# Patient Record
Sex: Female | Born: 1950 | Race: White | Hispanic: No | Marital: Married | Smoking: Former smoker
Health system: Southern US, Community
[De-identification: ages and names within clinical notes are randomized; demographics above are authoritative.]

## PROBLEM LIST (undated history)

## (undated) DIAGNOSIS — I10 Essential (primary) hypertension: Secondary | ICD-10-CM

## (undated) DIAGNOSIS — E785 Hyperlipidemia, unspecified: Secondary | ICD-10-CM

## (undated) HISTORY — PX: TUBAL LIGATION: SHX77

## (undated) HISTORY — PX: EYE SURGERY: SHX253

## (undated) HISTORY — PX: FOOT SURGERY: SHX648

## (undated) HISTORY — PX: TOTAL KNEE ARTHROPLASTY: SHX125

---

## 2001-05-10 ENCOUNTER — Encounter: Admission: RE | Admit: 2001-05-10 | Discharge: 2001-05-10 | Payer: Self-pay | Admitting: Orthopedic Surgery

## 2001-05-10 ENCOUNTER — Encounter: Payer: Self-pay | Admitting: Orthopedic Surgery

## 2006-08-27 ENCOUNTER — Emergency Department (HOSPITAL_COMMUNITY): Admission: EM | Admit: 2006-08-27 | Discharge: 2006-08-27 | Payer: Self-pay | Admitting: Emergency Medicine

## 2011-04-28 ENCOUNTER — Other Ambulatory Visit: Payer: Self-pay | Admitting: Orthopedic Surgery

## 2011-04-28 ENCOUNTER — Encounter (HOSPITAL_COMMUNITY): Payer: 59

## 2011-04-28 LAB — DIFFERENTIAL
Basophils Absolute: 0 10*3/uL (ref 0.0–0.1)
Basophils Relative: 0 % (ref 0–1)
Eosinophils Absolute: 0.2 10*3/uL (ref 0.0–0.7)
Eosinophils Relative: 3 % (ref 0–5)
Lymphocytes Relative: 32 % (ref 12–46)
Lymphs Abs: 2 10*3/uL (ref 0.7–4.0)
Monocytes Absolute: 0.5 10*3/uL (ref 0.1–1.0)
Monocytes Relative: 7 % (ref 3–12)
Neutro Abs: 3.6 10*3/uL (ref 1.7–7.7)
Neutrophils Relative %: 57 % (ref 43–77)

## 2011-04-28 LAB — URINALYSIS, ROUTINE W REFLEX MICROSCOPIC
Bilirubin Urine: NEGATIVE
Glucose, UA: NEGATIVE mg/dL
Ketones, ur: NEGATIVE mg/dL
Nitrite: NEGATIVE
Protein, ur: NEGATIVE mg/dL
Specific Gravity, Urine: 1.018 (ref 1.005–1.030)
Urobilinogen, UA: 0.2 mg/dL (ref 0.0–1.0)
pH: 6 (ref 5.0–8.0)

## 2011-04-28 LAB — COMPREHENSIVE METABOLIC PANEL
ALT: 23 U/L (ref 0–35)
AST: 22 U/L (ref 0–37)
Albumin: 3.8 g/dL (ref 3.5–5.2)
Alkaline Phosphatase: 109 U/L (ref 39–117)
BUN: 18 mg/dL (ref 6–23)
CO2: 27 mEq/L (ref 19–32)
Calcium: 9.8 mg/dL (ref 8.4–10.5)
Chloride: 102 mEq/L (ref 96–112)
Creatinine, Ser: 0.71 mg/dL (ref 0.50–1.10)
GFR calc Af Amer: >90 mL/min (ref >90)
GFR calc non Af Amer: >90 mL/min (ref >90)
Glucose, Bld: 83 mg/dL (ref 70–99)
Potassium: 4.2 mEq/L (ref 3.5–5.1)
Sodium: 138 mEq/L (ref 135–145)
Total Bilirubin: 0.2 mg/dL — ABNORMAL LOW (ref 0.3–1.2)
Total Protein: 7.3 g/dL (ref 6.0–8.3)

## 2011-04-28 LAB — SURGICAL PCR SCREEN
MRSA, PCR: NEGATIVE
Staphylococcus aureus: NEGATIVE

## 2011-04-28 LAB — CBC
HCT: 35.1 % — ABNORMAL LOW (ref 36.0–46.0)
Hemoglobin: 11.6 g/dL — ABNORMAL LOW (ref 12.0–15.0)
MCH: 29.5 pg (ref 26.0–34.0)
MCHC: 33 g/dL (ref 30.0–36.0)
MCV: 89.3 fL (ref 78.0–100.0)
Platelets: 227 10*3/uL (ref 150–400)
RBC: 3.93 MIL/uL (ref 3.87–5.11)
RDW: 13.1 % (ref 11.5–15.5)
WBC: 6.2 10*3/uL (ref 4.0–10.5)

## 2011-04-28 LAB — URINE MICROSCOPIC-ADD ON

## 2011-04-28 LAB — PROTIME-INR
INR: 0.93 (ref 0.00–1.49)
Prothrombin Time: 12.7 seconds (ref 11.6–15.2)

## 2011-04-28 LAB — APTT: aPTT: 27 seconds (ref 24–37)

## 2011-05-03 ENCOUNTER — Inpatient Hospital Stay (HOSPITAL_COMMUNITY)
Admission: RE | Admit: 2011-05-03 | Discharge: 2011-05-06 | DRG: 470 | Disposition: A | Discharge status: Home Health | Payer: 59 | Source: Ambulatory Visit | Attending: Orthopedic Surgery | Admitting: Orthopedic Surgery

## 2011-05-03 ENCOUNTER — Inpatient Hospital Stay (HOSPITAL_COMMUNITY): Payer: 59

## 2011-05-03 DIAGNOSIS — M1710 Unilateral primary osteoarthritis, unspecified knee: Principal | ICD-10-CM | POA: Diagnosis present

## 2011-05-03 DIAGNOSIS — R269 Unspecified abnormalities of gait and mobility: Secondary | ICD-10-CM | POA: Diagnosis present

## 2011-05-03 DIAGNOSIS — Z79899 Other long term (current) drug therapy: Secondary | ICD-10-CM

## 2011-05-03 DIAGNOSIS — Z7982 Long term (current) use of aspirin: Secondary | ICD-10-CM

## 2011-05-03 DIAGNOSIS — Z01812 Encounter for preprocedural laboratory examination: Secondary | ICD-10-CM

## 2011-05-03 LAB — TYPE AND SCREEN: ABO/RH(D): A POS

## 2011-05-03 LAB — ABO/RH: ABO/RH(D): A POS

## 2011-05-03 LAB — HEMOGLOBIN AND HEMATOCRIT, BLOOD: Hemoglobin: 11.4 g/dL — ABNORMAL LOW (ref 12.0–15.0)

## 2011-05-03 NOTE — H&P (Signed)
  [name], [name]                 ACCOUNT NO.:  [id number]  MEDICAL RECORD NO.:  [id number]  LOCATION:  1S                           FACILITY:  [facility]  PHYSICIAN:  Georges Lynch. Parul Porcelli, M.D.DATE OF BIRTH:  1951/05/15  DATE OF ADMISSION:  04/01/2011 DATE OF DISCHARGE:                             HISTORY & PHYSICAL   CHIEF COMPLAINT:  Left knee pain.  HISTORY OF PRESENT ILLNESS:  The patient is a 60 year old female with worsening left knee pain secondary to end-stage osteoarthritis.  The patient elected to have left total knee arthroplasty, Dr. Ranee Gosselin with decreased pain and increased function.  PAST MEDICAL HISTORY:  Negative.  FAMILY MEDICAL HISTORY:  Leukemia.  SOCIAL HISTORY:  The patient of Dr. Regino Schultze.  Occasional alcohol use. Does not smoke.  DRUG ALLERGIES:  None.  CURRENT MEDICATIONS: 1. Indomethacin 25 mg t.i.d. p.r.n. 2. Aspirin 81 mg daily.  REVIEW OF SYSTEMS:  Shows pain with ambulation and antalgic gait due to left knee pain.  PHYSICAL EXAMINATION:  VITAL SIGNS:  Pulse 76, respirations 16, and blood pressure 162/88. GENERAL:  The patient is a healthy-appearing 60 year old female, in no acute distress.  Pleasant mood and affect.  Alert and oriented x3. EXAMINATION OF HEAD AND NECK:  Shows cranial nerves II through XII are grossly intact.  She has full range of motion of the cervical spine. CHEST:  Active breath sounds bilaterally.  No wheezes, rhonchi, or rales. HEART:  Shows regular rate and rhythm.  No murmur. ABDOMEN:  Nontender and nondistended with active and active bowel sounds. EXTREMITIES:  Moderate tenderness bilateral knees, left greater than right especially in the medial joint line with some moderate crepitus. She does have some mild pedal edema distally.  She does have an antalgic gait. NEUROVASCULAR:  She is intact distally and capillary refill less than 2 seconds.  She has no rashes and reflexes are intact.  X-rays show end-stage  osteoarthritis of left knee.  IMPRESSION:  End-stage osteoarthritis, left knee.  PLAN OF ACTION:  Left total knee arthroplasty by Dr. Ranee Gosselin.     Thomas B. Dixon, P.A.   ______________________________ Georges Lynch [name], M.D.    TBD/MEDQ  D:  04/27/2011  T:  04/27/2011  Job:  [id number]  Electronically Signed by Standley Dakins P.A. on 05/03/2011 11:41:02 AM Electronically Signed by Ranee Gosselin M.D. on 05/03/2011 01:22:41 PM

## 2011-05-04 LAB — HEMOGLOBIN AND HEMATOCRIT, BLOOD: HCT: 33.8 % — ABNORMAL LOW (ref 36.0–46.0)

## 2011-05-05 LAB — HEMOGLOBIN AND HEMATOCRIT, BLOOD
HCT: 29.5 % — ABNORMAL LOW (ref 36.0–46.0)
Hemoglobin: 9.6 g/dL — ABNORMAL LOW (ref 12.0–15.0)

## 2011-05-05 LAB — PROTIME-INR: INR: 1.42 (ref 0.00–1.49)

## 2011-05-06 LAB — PROTIME-INR
INR: 1.57 — ABNORMAL HIGH (ref 0.00–1.49)
Prothrombin Time: 19.1 seconds — ABNORMAL HIGH (ref 11.6–15.2)

## 2011-05-07 NOTE — Discharge Summary (Signed)
  NAMEJOEY, [name]                 ACCOUNT NO.:  [id number]  MEDICAL RECORD NO.:  [id number]  LOCATION:  1617                         FACILITY:  [facility]  PHYSICIAN:  Georges Lynch. Evvie Behrmann, M.D.DATE OF BIRTH:  August 03, 1950  DATE OF ADMISSION:  05/03/2011 DATE OF DISCHARGE:                              DISCHARGE SUMMARY   She was taken to surgery by me at which time, we did a total knee arthroplasty on the left.  The surgery date was May 03, 2011.  The following day on May 04, 2011, she was stable.  I discontinued her Hemovac and she was maintained on Coumadin heparin protocol.  The following day on May 05, 2011, she was stable.  Hemoglobin was proximally was 9.6.  She was on a Coumadin heparin protocol.  Her dressing was changed and the wound looked excellent.  On May 06, 2011, this morning, her condition, she was stable.  Her INR was 1.57, hemoglobin 9.7, she was afebrile.  She has been ambulating.  She had been on a knee machine.  The final discharge date is today on May 06, 2011.  DISCHARGE DIET:  She will remain on the diet she was on prior to coming in __________ surgery.  DISCHARGE CONDITION:  Improved.  DISCHARGE MEDICINES:  Discharge medicines were all on the discharge manager plus I wrote her: 1. Percocet for pain 1 every 4 hours p.r.n. 2. She also will be maintained on Robaxin 500 mg t.i.d. p.r.n. for     spasms. 3. Coumadin 5 mg by mouth daily.  DISCHARGE INSTRUCTIONS:  Further, she will be seen at home by [facility] for manage her physical therapy, wound care and manage her Coumadin. She will have weekly INRs.  PERTINENT LABORATORY STUDIES:  Her screening test for MRSA and staph were negative.  Her hemoglobin on admission was 11.6, hematocrit 35.1, platelet count was 227,000.  She had a normal differential.  The electrolytes, sodium 138, potassium 4.2, chloride 102.  Her SGOT was 22, SGPT 23, alkaline phosphatase 109, bilirubin 0.2.  The INR was  0.93. Her PT was 12.7 and PTT was 27.  Urinalysis was within normal limits. Postop x-ray showed anatomic position of her prosthesis.          ______________________________ Georges Lynch [name], M.D.     RAG/MEDQ  D:  05/06/2011  T:  05/06/2011  Job:  [id number]  Electronically Signed by Ranee Gosselin M.D. on 05/07/2011 08:22:36 AM

## 2011-05-07 NOTE — Op Note (Signed)
[name], EDMUNDSON                 ACCOUNT NO.:  [id number]  MEDICAL RECORD NO.:  [id number]  LOCATION:  1617                         FACILITY:  [facility]  PHYSICIAN:  Georges Lynch. Offie Waide, M.D.DATE OF BIRTH:  1950-10-25  DATE OF PROCEDURE:  05/03/2011 DATE OF DISCHARGE:                              OPERATIVE REPORT   SURGEON:  Georges Lynch. [name], M.D.  ASSISTANT:  Jaquelyn Bitter. Chabon, P.A.  PREOPERATIVE DIAGNOSIS:  Severe degenerative arthritis, left knee.  POSTOPERATIVE DIAGNOSIS:  Severe degenerative arthritis, left knee.  OPERATION:  Left total knee arthroplasty utilizing DePuy system.  The sizes used were as follows:  The patella was a size 35, 3 pegs; the femoral component was a size 3, left cemented; the tibial tray was MBT revision tray size 4 cemented; the insert was a size 3, 12.5 mm thickness rotating platform.  I did use gentamicin in the cement.  PROCEDURE:  Under spinal anesthesia, routine orthopedic prep and draping of the left lower extremity was carried out.  The appropriate time-out was carried out before any incision were made.  In the holding area, I marked the appropriate left leg.  Following this, the leg was exsanguinated with Esmarch, and was also placed in a DeMayo knee holder. The knee was flexed.  The anterior incision was made.  Two flaps were created, and the flaps were reflected.  I then carried out a median and parapatellar incision reflecting the patella laterally.  The knee was flexed.  I did medial and lateral meniscectomies, and excised the anterior posterior cruciate ligaments.  Following that, a drill hole was made in the intercondylar notch.  The canal finer then was inserted.  I then thoroughly irrigated out the femoral canal.  The intramedullary guide rod was inserted.  I removed 12 mm thickness off the distal femur. Following that, the femur was measured to be a size 3.  I cut my size 3 femur with the appropriate instruments, carrying out  anterior, posterior chamfering cuts in the usual fashion.  Following that, the tibia was prepared in the usual fashion.  I completed my medial and lateral meniscectomies, then made sure I had completely excised the anterior- posterior cruciate.  I then measured the tibia to be a size 4 tray.  I then made my initial drill hole in the tibia for the MBT tray.  Prior to doing this, I utilized my spacer blocks to make sure we had the appropriate tension.  The appropriate tension was realized by using a 12.5 mm thickness insert.  Following that, I completed the completion. I removed 4 mm thickness off the affected medial side of the tibia, and I did use the intramedullary guide.  At this time, we then cut our keel cut of the proximal tibia in the usual fashion.  We then went up before inserting the trials and cut groove notch cut out of the distal femur. At this time, the trials were inserted.  We had excellent stability.  I did have a 12.5 mm thickness tray in place with the knee in extension. I then did a resurfacing procedure on the patella in the usual fashion. Three drill holes were made in the  patella.  Following that, the components were all removed.  I thoroughly irrigated out the knee, dried the knee, out cemented all 3 components in simultaneously utilizing gentamicin in the cement.  After the cement was hardened, all loose pieces of cement were removed.  I water picked the posterior compartments out, and made sure there was no loose pieces of the cement there.  Following that, before inserting my permanent prosthesis that is the tibial tray, I then injected 10 cc of FloSeal posteriorly, medially, laterally, and all around the soft tissue.  I then inserted my rotating platform tray.  The 12 mm thickness size 3 and reduced the knee and had excellent function, excellent stability.  I then inserted Hemovac drain and closed the wound layers with usual fashion over Hemovac drain. Sterile  Neosporin dressings were applied.  The patient had 2 g of IV Ancef.  Note during the case of procedure, Jodene Nam held the retractors.  He helped to remove the loose pieces of cement and he also helped with the synovectomy as well.  He also was  a Psychologist, occupational in regard to holding the knee in the proper position during the insertion of the prosthesis and the cement as well.  The indication for the procedure was the patient had previous MRI of the left knee.  She had previous noted to be bone-on-bone and also was treated with Euflexxa in the past and conservative medications by mouth as well.          ______________________________ Georges Lynch. [name], M.D.     RAG/MEDQ  D:  05/03/2011  T:  05/04/2011  Job:  [id number]  Electronically Signed by Ranee Gosselin M.D. on 05/07/2011 08:22:35 AM

## 2011-12-21 ENCOUNTER — Ambulatory Visit (INDEPENDENT_AMBULATORY_CARE_PROVIDER_SITE_OTHER): Payer: 59 | Admitting: Emergency Medicine

## 2011-12-21 ENCOUNTER — Ambulatory Visit: Payer: 59

## 2011-12-21 VITALS — BP 145/80 | HR 66 | Temp 98.5°F | Resp 16 | Ht 64.0 in | Wt 232.0 lb

## 2011-12-21 DIAGNOSIS — R21 Rash and other nonspecific skin eruption: Secondary | ICD-10-CM

## 2011-12-21 DIAGNOSIS — M549 Dorsalgia, unspecified: Secondary | ICD-10-CM

## 2011-12-21 LAB — POCT SKIN KOH: Skin KOH, POC: NEGATIVE

## 2011-12-21 MED ORDER — PREDNISONE 10 MG PO TABS: ORAL_TABLET | ORAL | Status: DC

## 2011-12-21 NOTE — Progress Notes (Signed)
  Subjective:    Patient ID: [name], female    DOB: Dec 13, 1950, 61 y.o.   MRN: [id number]  HPI patient is here with 2 complaints he her first complaint is pain in her right lower back. She states she was on her worse and worse stumbled and she was jerked and developed pain in her right SI area. She denies pain down her leg. She has a history of minimal problems with her back in the past. She is status post left knee replacement the other problem is a rash which developed Sunday night in the arm on she apparently was out on the right lung more on Sunday and this concerned that she possibly was exposed to poison ivy. She states as she mode in an area where grows but is not sure of direct contact.    Review of Systems      Objective:   Physical Exam there is tenderness over the right SI joint. There is no focal weakness into the lower sternum these appear straight leg raising is negative to a scar over her left knee is well-healed. Examination skin reveals raised maculopapular areas that are present over the chest and lower abdomen she also has lesions behind her neck and on the right side of her face.  In the area beneath her panniculus is to continue her meds rash which appears moist. The patient had applied cream to the area  [facility] reading (PRIMARY) by  Dr. Cleta Alberts , spine films showed significant facet arthritis otherwise disc spaces look normal  Results for orders placed in visit on 12/21/11  POCT SKIN KOH      Component Value Range   Skin KOH, POC Negative        Assessment & Plan:  She will be on a 6 day taper dose of prednisone for her back pain and poison ivy she will take Flexeril at night. She will use Lotrimin AF antifungal powder spray to the area beneath her panniculus. She will take either Claritin or Zyrtec one a day. She will take Benadryl 1-2 at bedtime .

## 2011-12-21 NOTE — Patient Instructions (Addendum)
Take Claritin or Zyrtec one a day during the day for itching. Take Benadryl 25 mg one to 2 at bedtime for itching use Lotrimin AF antifungal powder spray to the area of redness on her lower abdomen twice a day. Taper prednisone as prescribed .Contact Dermatitis Contact dermatitis is a reaction to certain substances that touch the skin. Contact dermatitis can be either irritant contact dermatitis or allergic contact dermatitis. Irritant contact dermatitis does not require previous exposure to the substance for a reaction to occur.Allergic contact dermatitis only occurs if you have been exposed to the substance before. Upon a repeat exposure, your body reacts to the substance.  CAUSES  Many substances can cause contact dermatitis. Irritant dermatitis is most commonly caused by repeated exposure to mildly irritating substances, such as:  Makeup.   Soaps.   Detergents.   Bleaches.   Acids.   Metal salts, such as nickel.  Allergic contact dermatitis is most commonly caused by exposure to:  Poisonous plants.   Chemicals (deodorants, shampoos).   Jewelry.   Latex.   Neomycin in triple antibiotic cream.   Preservatives in products, including clothing.  SYMPTOMS  The area of skin that is exposed may develop:  Dryness or flaking.   Redness.   Cracks.   Itching.   Pain or a burning sensation.   Blisters.  With allergic contact dermatitis, there may also be swelling in areas such as the eyelids, mouth, or genitals.  DIAGNOSIS  Your caregiver can usually tell what the problem is by doing a physical exam. In cases where the cause is uncertain and an allergic contact dermatitis is suspected, a patch skin test may be performed to help determine the cause of your dermatitis. TREATMENT Treatment includes protecting the skin from further contact with the irritating substance by avoiding that substance if possible. Barrier creams, powders, and gloves may be helpful. Your caregiver may  also recommend:  Steroid creams or ointments applied 2 times daily. For best results, soak the rash area in cool water for 20 minutes. Then apply the medicine. Cover the area with a plastic wrap. You can store the steroid cream in the refrigerator for a "chilly" effect on your rash. That may decrease itching. Oral steroid medicines may be needed in more severe cases.   Antibiotics or antibacterial ointments if a skin infection is present.   Antihistamine lotion or an antihistamine taken by mouth to ease itching.   Lubricants to keep moisture in your skin.   Burow's solution to reduce redness and soreness or to dry a weeping rash. Mix one packet or tablet of solution in 2 cups cool water. Dip a clean washcloth in the mixture, wring it out a bit, and put it on the affected area. Leave the cloth in place for 30 minutes. Do this as often as possible throughout the day.   Taking several cornstarch or baking soda baths daily if the area is too large to cover with a washcloth.  Harsh chemicals, such as alkalis or acids, can cause skin damage that is like a burn. You should flush your skin for 15 to 20 minutes with cold water after such an exposure. You should also seek immediate medical care after exposure. Bandages (dressings), antibiotics, and pain medicine may be needed for severely irritated skin.  HOME CARE INSTRUCTIONS  Avoid the substance that caused your reaction.   Keep the area of skin that is affected away from hot water, soap, sunlight, chemicals, acidic substances, or anything else that would  irritate your skin.   Do not scratch the rash. Scratching may cause the rash to become infected.   You may take cool baths to help stop the itching.   Only take over-the-counter or prescription medicines as directed by your caregiver.   See your caregiver for follow-up care as directed to make sure your skin is healing properly.  SEEK MEDICAL CARE IF:   Your condition is not better after 3  days of treatment.   You seem to be getting worse.   You see signs of infection such as swelling, tenderness, redness, soreness, or warmth in the affected area.   You have any problems related to your medicines.  Document Released: 06/17/2000 Document Revised: 06/09/2011 Document Reviewed: 11/23/2010 [facility] Patient Information 2012 [place], [place].

## 2012-08-22 ENCOUNTER — Ambulatory Visit (INDEPENDENT_AMBULATORY_CARE_PROVIDER_SITE_OTHER): Payer: 59 | Admitting: Family Medicine

## 2012-08-22 VITALS — BP 150/84 | HR 65 | Temp 98.0°F | Resp 16 | Ht 65.0 in | Wt 228.0 lb

## 2012-08-22 DIAGNOSIS — R059 Cough, unspecified: Secondary | ICD-10-CM

## 2012-08-22 DIAGNOSIS — J209 Acute bronchitis, unspecified: Secondary | ICD-10-CM

## 2012-08-22 MED ORDER — CEFDINIR 300 MG PO CAPS: 300.0000 mg | ORAL_CAPSULE | Freq: Two times a day (BID) | ORAL | Status: DC

## 2012-08-22 MED ORDER — HYDROCODONE-HOMATROPINE 5-1.5 MG/5ML PO SYRP: 5.0000 mL | ORAL_SOLUTION | Freq: Three times a day (TID) | ORAL | Status: DC | PRN

## 2012-08-22 NOTE — Patient Instructions (Addendum)
Use the cough syrup as needed but be careful as it can cause sedation. Use the antibiotic (omnicef) as directed.   If you are not better in the next few days please let me know- Sooner if worse.

## 2012-08-22 NOTE — Progress Notes (Signed)
[facility] [address] [place] [address] [id number]  Date:  08/22/2012   Name:  JAIMEE CORUM   DOB:  09-10-1950   MRN:  [id number]  PCP:  No primary provider on file.    Chief Complaint: Cough, Back Pain and Chest Pain   History of Present Illness:  [name] is a 62 y.o. very pleasant female patient who presents with the following:  She is here with cough and chest congestion, head congestion as well, for the last month or so.  She has noted lack of energy, but has not taken her temperature.  She does not think she has had a fever She does have chills.   She has noted a ST and runny nose No GI symptoms  She notes that her chest hurts when she coughs, and it hurts to press on her anterior chest  She takes just a baby asa daily, and is using alka seltzer and mucinex for her current illness.   There is no problem list on file for this patient.   No past medical history on file.  Past Surgical History  Procedure Laterality Date  . Eye surgery    . Tubal ligation    . Total knee arthroplasty    . Foot surgery      5 screws put in left foot    History  Substance Use Topics  . Smoking status: Former Games developer  . Smokeless tobacco: Not on file  . Alcohol Use: No    Family History  Problem Relation Age of Onset  . Heart disease Mother   . Hypertension Mother   . Diabetes Mother   . Diabetes Maternal Grandmother   . Diabetes Maternal Grandfather     No Known Allergies  Medication list has been reviewed and updated.  Current Outpatient Prescriptions on File Prior to Visit  Medication Sig Dispense Refill  . oxyCODONE-acetaminophen (PERCOCET) 10-325 MG per tablet Take 0.5 tablets by mouth every 6 (six) hours as needed. Old rx, has been taking for her back      . predniSONE (DELTASONE) 10 MG tablet Take 6 tablets a day for one day 5 tablets a day for one day 4 tablets a day for one day 3 tablets a day for one day 2 tablets a day for one  day one tablet a day for one  21 tablet  0   No current facility-administered medications on file prior to visit.    Review of Systems:  As per HPI- otherwise negative.   Physical Examination: Filed Vitals:   08/22/12 1457  BP: 150/84  Pulse: 65  Temp: 98 F (36.7 C)  Resp: 16   Filed Vitals:   08/22/12 1457  Height: 5\' 5"  (1.651 m)  Weight: 228 lb (103.42 kg)   Body mass index is 37.94 kg/(m^2). Ideal Body Weight: Weight in (lb) to have BMI = 25: 149.9  GEN: WDWN, NAD, Non-toxic, A & O x 3, overweight HEENT: Atraumatic, Normocephalic. Neck supple. No masses, No LAD.  Bilateral TM wnl, oropharynx normal.  PEERL,EOMI.   Sinuses are non- tender Ears and Nose: No external deformity. CV: RRR, No M/G/R. No JVD. No thrill. No extra heart sounds.  She is tender over her costochondral joints PULM: CTA B, no wheezes, crackles, rhonchi. No retractions. No resp. distress. No accessory muscle use. ABD: S, NT, ND, +BS. No rebound. No HSM.   EXTR: No c/c/e NEURO Normal gait.  PSYCH: Normally interactive.  Conversant. Not depressed or anxious appearing.  Calm demeanor.    Assessment and Plan: Cough - Plan: HYDROcodone-homatropine (HYCODAN) 5-1.5 MG/5ML syrup  Acute bronchitis - Plan: cefdinir (OMNICEF) 300 MG capsule  See patient instructions for more details.     Abbe Amsterdam, MD

## 2013-10-25 ENCOUNTER — Ambulatory Visit: Payer: 59

## 2013-10-25 ENCOUNTER — Ambulatory Visit (INDEPENDENT_AMBULATORY_CARE_PROVIDER_SITE_OTHER): Payer: 59 | Admitting: Internal Medicine

## 2013-10-25 ENCOUNTER — Ambulatory Visit (HOSPITAL_COMMUNITY)
Admission: RE | Admit: 2013-10-25 | Discharge: 2013-10-25 | Disposition: A | Discharge status: Home / Self Care | Payer: 59 | Source: Ambulatory Visit | Attending: Internal Medicine | Admitting: Internal Medicine

## 2013-10-25 VITALS — BP 146/80 | HR 69 | Temp 98.0°F | Resp 18 | Ht 64.0 in | Wt 234.0 lb

## 2013-10-25 DIAGNOSIS — M7989 Other specified soft tissue disorders: Secondary | ICD-10-CM

## 2013-10-25 DIAGNOSIS — M79609 Pain in unspecified limb: Secondary | ICD-10-CM

## 2013-10-25 DIAGNOSIS — M25569 Pain in unspecified knee: Secondary | ICD-10-CM

## 2013-10-25 MED ORDER — HYDROCODONE-ACETAMINOPHEN 5-325 MG PO TABS: 1.0000 | ORAL_TABLET | Freq: Four times a day (QID) | ORAL | Status: DC | PRN

## 2013-10-25 NOTE — Progress Notes (Signed)
   Subjective:    Patient ID: [name], female    DOB: 1950/09/10, 63 y.o.   MRN: [id number]  HPI  63 YO female patient comes in today with complaints of her right leg swelling and hurting. She states the pain is specific to below her knee to just above her ankle. The pain is keeping her awake at night. Does not resolve with ice and elevation. The swelling has progressively gotten worse over the past week and has started increasing upward. It hurts worse with walking extended periods. Pain with palpitation. Pain most anterior knee.  She states it would swell when she was on her feet for extended periods and would resolve with rest and elevation in the past.  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 She also takes care of her mother and is not able to rest as much as she used to. She does have some arthritis in her right knee.  Hx of TKR on left. No hx of inactivity, no hx cancer, no hx of DVT or PE. No injury noted.  No history of cardiac problems. No history of DVT.  No history of circulation problems. Denies SOB, CP  Former smoker quit 16 years ago 1 ppd Joint replacement in her left knee.   Review of Systems     Objective:   Physical Exam  Constitutional: She is oriented to person, place, and time. She appears well-developed and well-nourished. No distress.  HENT:  Head: Normocephalic.  Eyes: EOM are normal.  Neck: Normal range of motion.  Cardiovascular: Normal rate, regular rhythm and normal heart sounds.   Pulmonary/Chest: Effort normal and breath sounds normal.  Musculoskeletal: She exhibits tenderness.       Right knee: She exhibits normal range of motion, no swelling, no effusion, no ecchymosis, no deformity, no erythema, normal alignment, no LCL laxity and no MCL laxity. Tenderness found.       Right lower leg: She exhibits tenderness, bony tenderness and swelling. She exhibits no deformity and no laceration.  Neurological:  She is alert and oriented to person, place, and time. She exhibits normal muscle tone. Coordination normal.  Skin: No rash noted.  Psychiatric: She has a normal mood and affect.   Calf Circumference:   Left 17.5 inches  Right 17.5 Inches Thigh Circumference: Left 20.5 inches Right 21.5 Inches  No clear swelling noticeable due to obesity Most tender anterior shin right [facility] reading (PRIMARY) by  [name] Knee and calf no fx, moderate djd, no abnormality seen  Gait is normal/no limp    Assessment & Plan:  Leg strain/Pain/Swelloing Doppler tonite r/o dvt Refer to ortho next week--Victoria. Luther ParodyGioffrie

## 2013-10-25 NOTE — Progress Notes (Signed)
*  Preliminary Results* Right lower extremity venous duplex completed. Right lower extremity is negative for deep vein thrombosis. There is no evidence of right Baker's cyst.  10/25/2013 5:29 PM  Gertie FeyMichelle Evangaline Jou, RVT, RDCS, RDMS

## 2013-10-25 NOTE — Patient Instructions (Addendum)
[facility] for [facility] doppler Stress Fracture When too much stress is put on the foot, as may occur in running and jumping sports, the lengthy shafts of the bones of the forefoot become susceptible to breaking due to repetitive stress (stress fracture) because of thinness of these bone. A stress fracture is more common if osteoporosis is present or if inadequate athletic footwear is used. Shoes should be used which adequately support the sole of the foot to absorb the shocks of the activity participated in. Stress fractures are very common in competitive female runners who develop these small cracks on the surface of the bones in their legs and feet. The women most likely to suffer these injuries are those who restrict food intake and those who have irregular periods. Stress fractures usually start out as a minor discomfort in the foot or leg. The completion of fracture due to repetitive loading often occurs near the end of a long run. The pain may dissipate with rest. With the next exercise session, the pain may return earlier in the run. If an athlete notices that it hurts to touch just one spot on a bone and then stops running for a week, he or she may be tempted to return to running too soon. Often the pain is ignored in order to continue with high impact exercise. A stress fracture then develops. The athlete now has to avoid the hard pounding of running, but can ride a bike or swim for exercise once the pain has resolved with normal weight bearing until the fracture heals in 6 12 weeks. The most common sites for stress fractures are the bones in the front of the feet (metatarsals) and the long bone of the lower leg (tibia), but running can cause stress fractures anywhere in the lower extremities or pelvis. DIAGNOSIS  Usually the diagnosis is made by reviewing the patient's history. The bone involved progressively becomes more painful with activities. X-rays may show no break  within the first 2 3 weeks that pain begins. A later X-ray may show signs that the bone is healing. Having a bone scan or MRI usually makes an earlier diagnosis possible. HOME CARE INSTRUCTIONS  Treatment may include a cast or walking shoe.  High impact activities should be stopped until advised by your caregiver.  Wear shoes with adequate shock absorbing abilities and good support of the sole of the foot. This is especially important in the arch of the foot.  Alternative exercise may be undertaken while waiting for healing. This may include bicycling and swimming. If you do not have a cast or splint:  You may walk on your injured foot as tolerated or advised.  Do not put any weight on your injured foot until instructed. Slowly increase the amount of time you walk on the foot as the pain allows or as advised.  Use crutches until you can bear weight without pain. A gradual increase in weight bearing may help.  Apply ice to the injured area for the first 2 days after you have been treated or as directed by your caregiver.  Put ice in a plastic bag.  Place a towel between your skin and the bag.  Leave the ice on for 15 20 minutes at a time, every hour while you are awake.  Only take over-the-counter or prescription medicines for pain or discomfort as directed by your caregiver.  If your caregiver has given you a follow-up appointment, it is very important to keep that  appointment. Not keeping the appointment could result in a chronic or permanent injury, pain, and disability. SEEK IMMEDIATE MEDICAL CARE IF:   Pain is becoming worse rather than better.  Pain is uncontrolled with medicine.  You have increased swelling or redness in the foot.  The feeling in the foot or leg is diminished. MAKE SURE YOU:   Understand these instructions.  Will watch your condition.  Will get help right away if you are not doing well or get worse. Document Released: 09/10/2002 Document Revised:  10/15/2012 Document Reviewed: 02/04/2008 [facility] Patient Information 2014 [employer] [place]. Deep Vein Thrombosis A deep vein thrombosis (DVT) is a blood clot that develops in the deep, larger veins of the leg, arm, or pelvis. These are more dangerous than clots that might form in veins near the surface of the body. A DVT can lead to complications if the clot breaks off and travels in the bloodstream to the lungs.  A DVT can damage the valves in your leg veins, so that instead of flowing upward, the blood pools in the lower leg. This is called post-thrombotic syndrome, and it can result in pain, swelling, discoloration, and sores on the leg. CAUSES Usually, several things contribute to blood clots forming. Contributing factors include:  The flow of blood slows down.  The inside of the vein is damaged in some way.  You have a condition that makes blood clot more easily. RISK FACTORS Some people are more likely than others to develop blood clots. Risk factors include:   Older age, especially over 62 years of age.  Having a family history of blood clots or if you have already had a blot clot.  Having major or lengthy surgery. This is especially true for surgery on the hip, knee, or belly (abdomen). Hip surgery is particularly high risk.  Breaking a hip or leg.  Sitting or lying still for a long time. This includes long-distance travel, paralysis, or recovery from an illness or surgery.  Having cancer or cancer treatment.  Having a long, thin tube (catheter) placed inside a vein during a medical procedure.  Being overweight (obese).  Pregnancy and childbirth.  Hormone changes make the blood clot more easily during pregnancy.  The fetus puts pressure on the veins of the pelvis.  There is a risk of injury to veins during delivery or a caesarean. The risk is highest just after childbirth.  Medicines with the female hormone estrogen. This includes birth control pills and hormone  replacement therapy.  Smoking.  Other circulation or heart problems.  SIGNS AND SYMPTOMS When a clot forms, it can either partially or totally block the blood flow in that vein. Symptoms of a DVT can include:  Swelling of the leg or arm, especially if one side is much worse.  Warmth and redness of the leg or arm, especially if one side is much worse.  Pain in an arm or leg. If the clot is in the leg, symptoms may be more noticeable or worse when standing or walking. The symptoms of a DVT that has traveled to the lungs (pulmonary embolism, PE) usually start suddenly and include:  Shortness of breath.  Coughing.  Coughing up blood or blood-tinged phlegm.  Chest pain. The chest pain is often worse with deep breaths.  Rapid heartbeat. Anyone with these symptoms should get emergency medical treatment right away. Call your local emergency services (911 in the [place] if you have these symptoms. DIAGNOSIS If a DVT is suspected, your health care provider  will take a full medical history and perform a physical exam. Tests that also may be required include:  Blood tests, including studies of the clotting properties of the blood.  Ultrasonography to see if you have clots in your legs or lungs.  X-rays to show the flow of blood when dye is injected into the veins (venography).  Studies of your lungs if you have any chest symptoms. PREVENTION  Exercise the legs regularly. Take a brisk 30-minute walk every day.  Maintain a weight that is appropriate for your height.  Avoid sitting or lying in bed for long periods of time without moving your legs.  Women, particularly those over the age of 35 years, should consider the risks and benefits of taking estrogen medicines, including birth control pills.  Do not smoke, especially if you take estrogen medicines.  Long-distance travel can increase your risk of DVT. You should exercise your legs by walking or pumping the muscles every  hour.  In-hospital prevention:  Many of the risk factors above relate to situations that exist with hospitalization, either for illness, injury, or elective surgery.  Your health care provider will assess you for the need for venous thromboembolism prophylaxis when you are admitted to the hospital. If you are having surgery, your surgeon will assess you the day of or day after surgery.  Prevention may include medical and nonmedical measures. TREATMENT Once identified, a DVT can be treated. It can also be prevented in some circumstances. Once you have had a DVT, you may be at increased risk for a DVT in the future. The most common treatment for DVT is blood thinning (anticoagulant) medicine, which reduces the blood's tendency to clot. Anticoagulants can stop new blood clots from forming and stop old ones from growing. They cannot dissolve existing clots. Your body does this by itself over time. Anticoagulants can be given by mouth, by IV access, or by injection. Your health care provider will determine the best program for you. Other medicines or treatments that may be used are:  Heparin or related medicines (low molecular weight heparin) are usually the first treatment for a blood clot. They act quickly. However, they cannot be taken orally.  Heparin can cause a fall in a component of blood that stops bleeding and forms blood clots (platelets). You will be monitored with blood tests to be sure this does not occur.  Warfarin is an anticoagulant that can be swallowed. It takes a few days to start working, so usually heparin or related medicines are used in combination. Once warfarin is working, heparin is usually stopped.  Less commonly, clot dissolving drugs (thrombolytics) are used to dissolve a DVT. They carry a high risk of bleeding, so they are used mainly in severe cases, where your life or a limb is threatened.  Very rarely, a blood clot in the leg needs to be removed surgically.  If you  are unable to take anticoagulants, your health care provider may arrange for you to have a filter placed in a main vein in your abdomen. This filter prevents clots from traveling to your lungs. HOME CARE INSTRUCTIONS  Take all medicines prescribed by your health care provider. Only take over-the-counter or prescription medicines for pain, fever, or discomfort as directed by your health care provider.  Warfarin. Most people will continue taking warfarin after hospital discharge. Your health care provider will advise you on the length of treatment (usually 3 6 months, sometimes lifelong).  Too much and too little warfarin are both  dangerous. Too much warfarin increases the risk of bleeding. Too little warfarin continues to allow the risk for blood clots. While taking warfarin, you will need to have regular blood tests to measure your blood clotting time. These blood tests usually include both the prothrombin time (PT) and international normalized ratio (INR) tests. The PT and INR results allow your health care provider to adjust your dose of warfarin. The dose can change for many reasons. It is critically important that you take warfarin exactly as prescribed, and that you have your PT and INR levels drawn exactly as directed.  Many foods, especially foods high in vitamin K, can interfere with warfarin and affect the PT and INR results. Foods high in vitamin K include spinach, kale, broccoli, cabbage, collard and turnip greens, brussel sprouts, peas, cauliflower, seaweed, and parsley as well as beef and pork liver, green tea, and soybean oil. You should eat a consistent amount of foods high in vitamin K. Avoid major changes in your diet, or notify your health care provider before changing your diet. Arrange a visit with a dietitian to answer your questions.  Many medicines can interfere with warfarin and affect the PT and INR results. You must tell your health care provider about any and all medicines you  take. This includes all vitamins and supplements. Be especially cautious with aspirin and anti-inflammatory medicines. Ask your health care provider before taking these. Do not take or discontinue any prescribed or over-the-counter medicine except on the advice of your health care provider or pharmacist.  Warfarin can have side effects, primarily excessive bruising or bleeding. You will need to hold pressure over cuts for longer than usual. Your health care provider or pharmacist will discuss other potential side effects.  Alcohol can change the body's ability to handle warfarin. It is best to avoid alcoholic drinks or consume only very small amounts while taking warfarin. Notify your health care provider if you change your alcohol intake.  Notify your dentist or other health care providers before procedures.  Activity. Ask your health care provider how soon you can go back to normal activities. It is important to stay active to prevent blood clots. If you are on anticoagulant medicine, avoid contact sports.  Exercise. It is very important to exercise. This is especially important while traveling, sitting, or standing for long periods of time. Exercise your legs by walking or by pumping the muscles frequently. Take frequent walks.  Compression stockings. These are tight elastic stockings that apply pressure to the lower legs. This pressure can help keep the blood in the legs from clotting. You may need to wear compression stockings at home to help prevent a DVT.  Do not smoke. If you smoke, quit. Ask your health care provider for help with quitting smoking.  Learn as much as you can about DVT. Knowing more about the condition should help you keep it from coming back.  Wear a medical alert bracelet or carry a medical alert card. SEEK MEDICAL CARE IF:  You notice a rapid heartbeat.  You feel weaker or more tired than usual.  You feel faint.  You notice increased bruising.  You feel your  symptoms are not getting better in the time expected.  You believe you are having side effects of medicine. SEEK IMMEDIATE MEDICAL CARE IF:  You have chest pain.  You have trouble breathing.  You have new or increased swelling or pain in one leg.  You cough up blood.  You notice blood in vomit,  in a bowel movement, or in urine. MAKE SURE YOU:  Understand these instructions.  Will watch your condition.  Will get help right away if you are not doing well or get worse. Document Released: 06/20/2005 Document Revised: 04/10/2013 Document Reviewed: 02/25/2013 [facility] Patient Information 2014 [place], [place]. Arthritis, Nonspecific Arthritis is inflammation of a joint. This usually means pain, redness, warmth or swelling are present. One or more joints may be involved. There are a number of types of arthritis. Your caregiver may not be able to tell what type of arthritis you have right away. CAUSES  The most common cause of arthritis is the wear and tear on the joint (osteoarthritis). This causes damage to the cartilage, which can break down over time. The knees, hips, back and neck are most often affected by this type of arthritis. Other types of arthritis and common causes of joint pain include:  Sprains and other injuries near the joint. Sometimes minor sprains and injuries cause pain and swelling that develop hours later.  Rheumatoid arthritis. This affects hands, feet and knees. It usually affects both sides of your body at the same time. It is often associated with chronic ailments, fever, weight loss and general weakness.  [place] arthritis. Gout and pseudo gout can cause occasional acute severe pain, redness and swelling in the foot, ankle, or knee.  Infectious arthritis. Bacteria can get into a joint through a break in overlying skin. This can cause infection of the joint. Bacteria and viruses can also spread through the blood and affect your joints.  Drug, infectious and  allergy reactions. Sometimes joints can become mildly painful and slightly swollen with these types of illnesses. SYMPTOMS   Pain is the main symptom.  Your joint or joints can also be red, swollen and warm or hot to the touch.  You may have a fever with certain types of arthritis, or even feel overall ill.  The joint with arthritis will hurt with movement. Stiffness is present with some types of arthritis. DIAGNOSIS  Your caregiver will suspect arthritis based on your description of your symptoms and on your exam. Testing may be needed to find the type of arthritis:  Blood and sometimes urine tests.  X-ray tests and sometimes CT or MRI scans.  Removal of fluid from the joint (arthrocentesis) is done to check for bacteria, crystals or other causes. Your caregiver (or a specialist) will numb the area over the joint with a local anesthetic, and use a needle to remove joint fluid for examination. This procedure is only minimally uncomfortable.  Even with these tests, your caregiver may not be able to tell what kind of arthritis you have. Consultation with a specialist (rheumatologist) may be helpful. TREATMENT  Your caregiver will discuss with you treatment specific to your type of arthritis. If the specific type cannot be determined, then the following general recommendations may apply. Treatment of severe joint pain includes:  Rest.  Elevation.  Anti-inflammatory medication (for example, ibuprofen) may be prescribed. Avoiding activities that cause increased pain.  Only take over-the-counter or prescription medicines for pain and discomfort as recommended by your caregiver.  Cold packs over an inflamed joint may be used for 10 to 15 minutes every hour. Hot packs sometimes feel better, but do not use overnight. Do not use hot packs if you are diabetic without your caregiver's permission.  A cortisone shot into arthritic joints may help reduce pain and swelling.  Any acute arthritis  that gets worse over the next 1 to  2 days needs to be looked at to be sure there is no joint infection. Long-term arthritis treatment involves modifying activities and lifestyle to reduce joint stress jarring. This can include weight loss. Also, exercise is needed to nourish the joint cartilage and remove waste. This helps keep the muscles around the joint strong. HOME CARE INSTRUCTIONS   Do not take aspirin to relieve pain if gout is suspected. This elevates uric acid levels.  Only take over-the-counter or prescription medicines for pain, discomfort or fever as directed by your caregiver.  Rest the joint as much as possible.  If your joint is swollen, keep it elevated.  Use crutches if the painful joint is in your leg.  Drinking plenty of fluids may help for certain types of arthritis.  Follow your caregiver's dietary instructions.  Try low-impact exercise such as:  Swimming.  Water aerobics.  Biking.  Walking.  Morning stiffness is often relieved by a warm shower.  Put your joints through regular range-of-motion. SEEK MEDICAL CARE IF:   You do not feel better in 24 hours or are getting worse.  You have side effects to medications, or are not getting better with treatment. SEEK IMMEDIATE MEDICAL CARE IF:   You have a fever.  You develop severe joint pain, swelling or redness.  Many joints are involved and become painful and swollen.  There is severe back pain and/or leg weakness.  You have loss of bowel or bladder control. Document Released: 07/28/2004 Document Revised: 09/12/2011 Document Reviewed: 08/13/2008 [facility] Patient Information 2014 [place], [place].

## 2013-10-26 ENCOUNTER — Telehealth: Payer: Self-pay

## 2013-10-26 NOTE — Telephone Encounter (Signed)
Patient needs to know her xray results, she was told the radiologist has already possibly written their impressions on it. Please call if ready. I told her Dr. Perrin MalteseGuest doesn't return until Tuesday and maybe someone else could possibly read it if not he will call her with results. She understood, she may get a call tomorrow or when he returns. She just wanted to know so she can follow up with her ortho dr. Please call if results are ready. Thanks!

## 2013-10-26 NOTE — Telephone Encounter (Signed)
Can you review please. Thanks

## 2013-10-27 NOTE — Telephone Encounter (Signed)
OVer-read was consistent with Dr. Ernestene MentionGuest's readings.  The tib-fib was normal.  No fracture.  The knee revealed arthritis.

## 2013-10-27 NOTE — Telephone Encounter (Signed)
Pt.notified

## 2015-07-28 IMAGING — CR DG TIBIA/FIBULA 2V*R*
3 series · 3 of 3 positions shown · non-contrast
Comparison: None.

CLINICAL DATA: Leg pain.

EXAM:
RIGHT TIBIA AND FIBULA - 2 VIEW

[AP (1 of 2)]
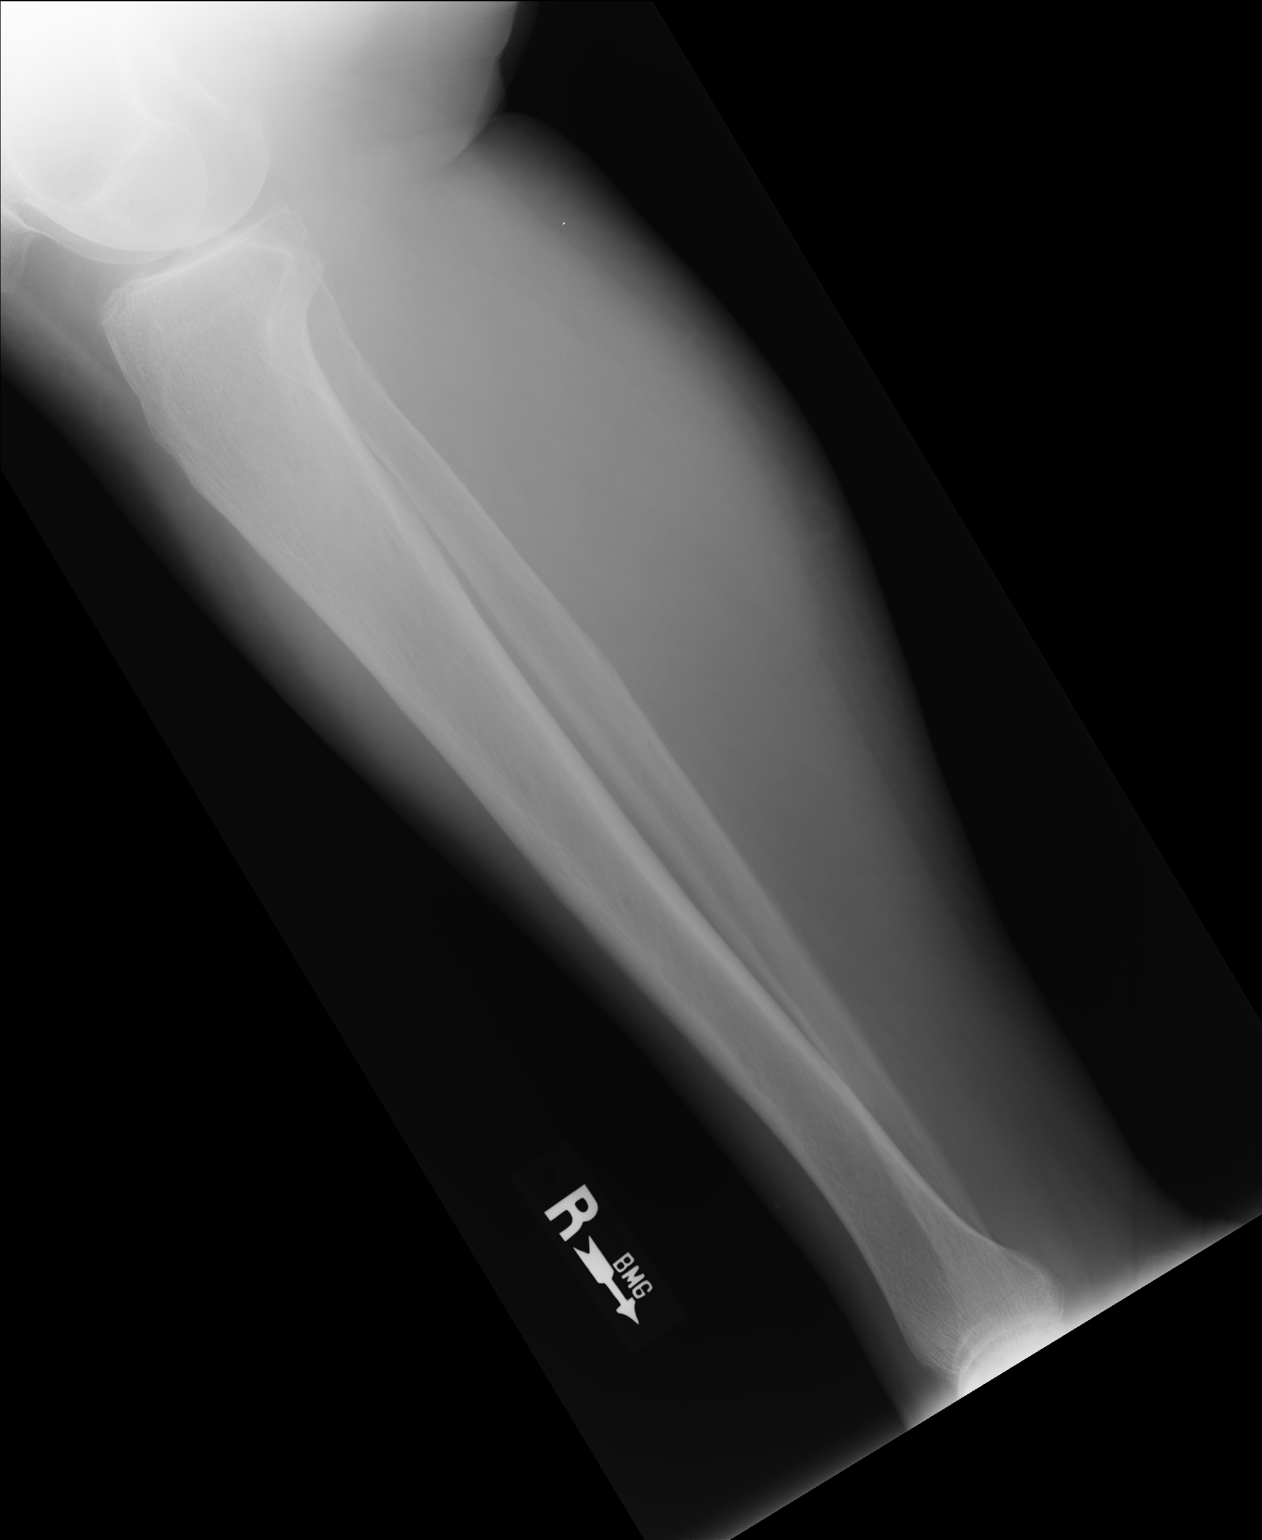

[lateral]
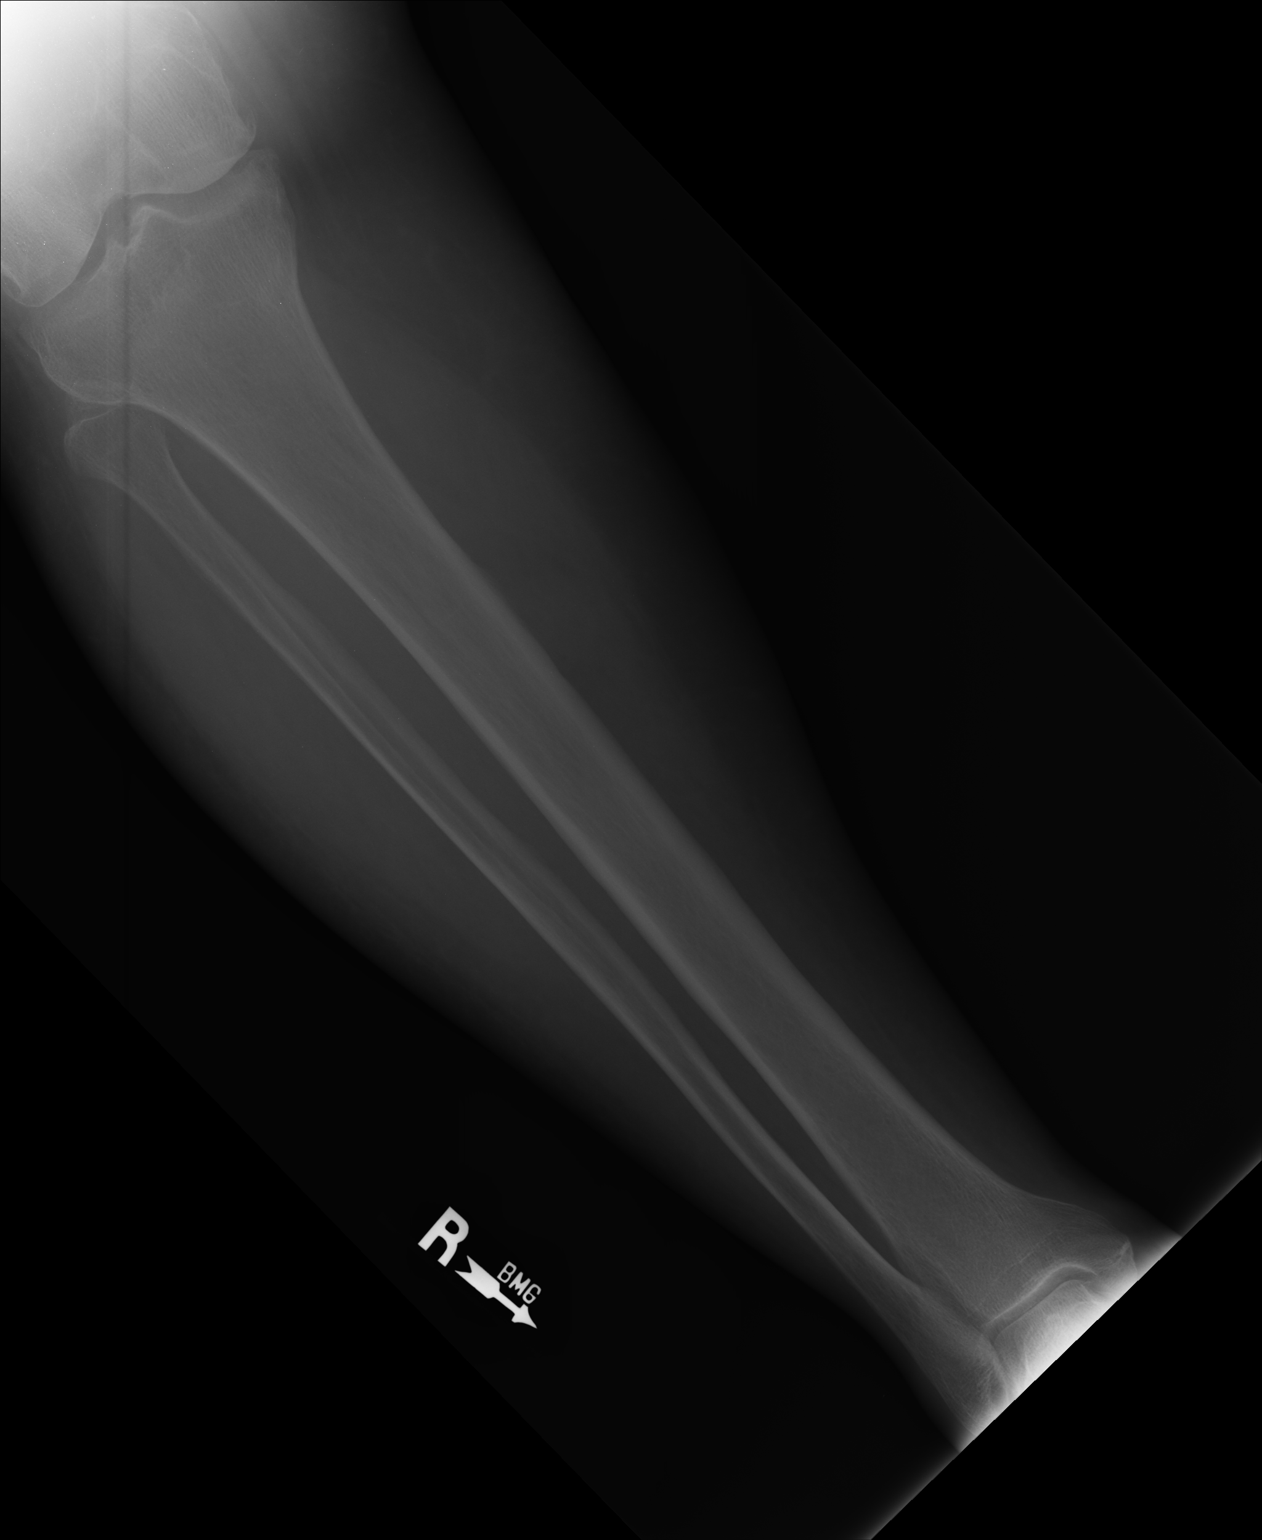

[AP (2 of 2)]
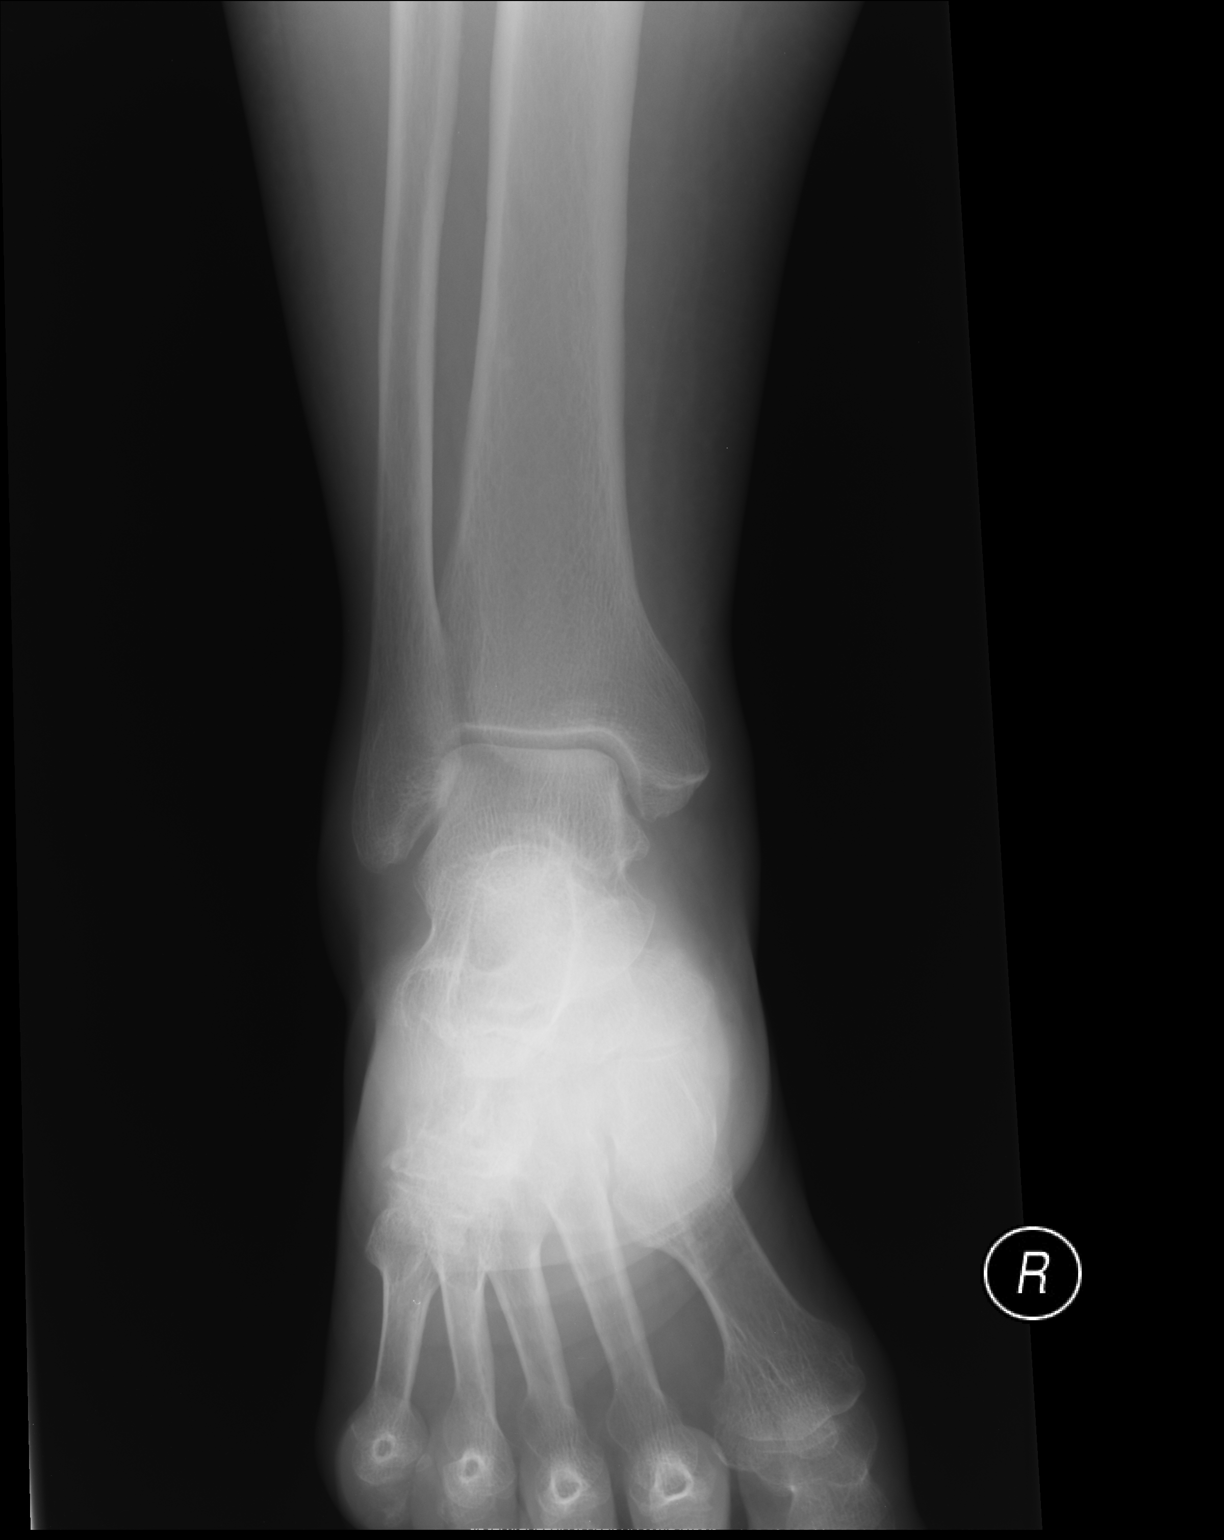

[3 of 3 positions shown; findings below may reference images not displayed]

FINDINGS: The mineralization and alignment are normal. There is no evidence of
acute fracture or dislocation. No focal soft tissue abnormalities
are identified.
IMPRESSION: Negative lower leg radiographs.

## 2017-05-12 DIAGNOSIS — H40033 Anatomical narrow angle, bilateral: Secondary | ICD-10-CM | POA: Diagnosis not present

## 2017-05-12 DIAGNOSIS — H1013 Acute atopic conjunctivitis, bilateral: Secondary | ICD-10-CM | POA: Diagnosis not present

## 2018-04-27 DIAGNOSIS — H43393 Other vitreous opacities, bilateral: Secondary | ICD-10-CM | POA: Diagnosis not present

## 2018-04-27 DIAGNOSIS — H40033 Anatomical narrow angle, bilateral: Secondary | ICD-10-CM | POA: Diagnosis not present

## 2018-12-28 DIAGNOSIS — S0990XA Unspecified injury of head, initial encounter: Secondary | ICD-10-CM | POA: Diagnosis not present

## 2018-12-28 DIAGNOSIS — R079 Chest pain, unspecified: Secondary | ICD-10-CM | POA: Diagnosis not present

## 2018-12-28 DIAGNOSIS — G8911 Acute pain due to trauma: Secondary | ICD-10-CM | POA: Diagnosis not present

## 2018-12-28 DIAGNOSIS — M25552 Pain in left hip: Secondary | ICD-10-CM | POA: Diagnosis not present

## 2018-12-28 DIAGNOSIS — S298XXA Other specified injuries of thorax, initial encounter: Secondary | ICD-10-CM | POA: Diagnosis not present

## 2018-12-28 DIAGNOSIS — M1990 Unspecified osteoarthritis, unspecified site: Secondary | ICD-10-CM | POA: Diagnosis not present

## 2018-12-28 DIAGNOSIS — R0789 Other chest pain: Secondary | ICD-10-CM | POA: Diagnosis not present

## 2018-12-28 DIAGNOSIS — M160 Bilateral primary osteoarthritis of hip: Secondary | ICD-10-CM | POA: Diagnosis not present

## 2018-12-28 DIAGNOSIS — S20212A Contusion of left front wall of thorax, initial encounter: Secondary | ICD-10-CM | POA: Diagnosis not present

## 2019-06-04 ENCOUNTER — Other Ambulatory Visit: Payer: Self-pay | Admitting: Internal Medicine

## 2019-06-04 DIAGNOSIS — Z1231 Encounter for screening mammogram for malignant neoplasm of breast: Secondary | ICD-10-CM

## 2020-11-16 ENCOUNTER — Ambulatory Visit: Payer: Medicare HMO

## 2020-11-16 ENCOUNTER — Ambulatory Visit (INDEPENDENT_AMBULATORY_CARE_PROVIDER_SITE_OTHER): Payer: Medicare HMO | Admitting: Orthopedic Surgery

## 2020-11-16 ENCOUNTER — Encounter: Payer: Self-pay | Admitting: Orthopedic Surgery

## 2020-11-16 ENCOUNTER — Other Ambulatory Visit: Payer: Self-pay

## 2020-11-16 VITALS — BP 143/79 | HR 77 | Resp 16 | Ht 64.0 in | Wt 210.6 lb

## 2020-11-16 DIAGNOSIS — M1711 Unilateral primary osteoarthritis, right knee: Secondary | ICD-10-CM | POA: Diagnosis not present

## 2020-11-16 DIAGNOSIS — M1710 Unilateral primary osteoarthritis, unspecified knee: Secondary | ICD-10-CM

## 2020-11-16 MED ORDER — MELOXICAM 7.5 MG PO TABS: 7.5000 mg | ORAL_TABLET | Freq: Every day | ORAL | 5 refills | Status: DC

## 2020-11-16 NOTE — Progress Notes (Signed)
  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  Patient was given an injection and started on anti-inflammatory medication  Follow-up in 4 weeks  Chief Complaint  Patient presents with  . New Patient (Initial Visit)    Right knee    70 year old female status post left total knee did well presents with chronic right knee pain.  She says it feels like an ice pick is going to the medial side of her left knee this is exacerbated by standing and walking at work.  Pain is been progressing over a year   Review of Systems  Constitutional: Positive for malaise/fatigue.  Cardiovascular: Positive for leg swelling.  Endo/Heme/Allergies: Positive for environmental allergies. Bruises/bleeds easily.  All other systems reviewed and are negative.    No past medical history on file.  Past Surgical History:  Procedure Laterality Date  . EYE SURGERY    . FOOT SURGERY     5 screws put in left foot  . TOTAL KNEE ARTHROPLASTY    . TUBAL LIGATION      Family History  Problem Relation Age of Onset  . Heart disease Mother   . Hypertension Mother   . Diabetes Mother   . Diabetes Maternal Grandmother   . Diabetes Maternal Grandfather    Social History   Tobacco Use  . Smoking status: Former Games developer  . Smokeless tobacco: Never Used  Substance Use Topics  . Alcohol use: No  . Drug use: No    No Known Allergies  Current Meds  Medication Sig  . meloxicam (MOBIC) 7.5 MG tablet Take 1 tablet (7.5 mg total) by mouth daily.    BP (!) 143/79   Pulse 77   Resp 16   Ht 5\' 4"  (1.626 m)   Wt 210 lb 9.6 oz (95.5 kg)   BMI 36.15 kg/m   Physical Exam  General appearance: Well-developed well-nourished no gross deformities  Cardiovascular normal pulse and perfusion normal color with bilateral edema  Neurologically no sensation loss or deficits or pathologic reflexes  Psychological: Awake alert and  oriented x3 mood and affect normal  Skin no lacerations or ulcerations no nodularity no palpable masses, no erythema or nodularity  Musculoskeletal:   Tenderness over the medial compartment of the right knee with no effusion skin is intact range of motion is 5-1 20 knee is stable muscle tone is normal     MEDICAL DECISION MAKING  A.  Encounter Diagnosis  Name Primary?  Arthritis of knee Yes    B. DATA ANALYSED:   IMAGING: Interpretation of images: Internal images show varus mild severe arthritis medial compartment mild arthritis lateral and patellofemoral compartment and secondary bone changes of osteophytes  Orders: None  Outside records reviewed: None   C. MANAGEMENT   Injection right knee Permission given Site confirmed his right knee Medication Celestone 6 mg lidocaine 1% 2 cc Lateral approach Alcohol Ethyl chloride 20-gauge needle Injection given no complications    Meds ordered this encounter  Medications  . meloxicam (MOBIC) 7.5 MG tablet    Sig: Take 1 tablet (7.5 mg total) by mouth daily.    Dispense:  30 tablet    Refill:  5   4 weeks   Marland Kitchen, MD  11/16/2020 1:52 PM

## 2020-11-16 NOTE — Patient Instructions (Addendum)
You have received an injection of steroids into the joint. 15% of patients will have increased pain within the 24 hours postinjection.   This is transient and will go away.   We recommend that you use ice packs on the injection site for 20 minutes every 2 hours and extra strength Tylenol 2 tablets every 8 as needed until the pain resolves.  If you continue to have pain after taking the Tylenol and using the ice please call the office for further instructions.  Osteoarthritis  Osteoarthritis is a type of arthritis. It refers to joint pain or joint disease. Osteoarthritis affects tissue that covers the ends of bones in joints (cartilage). Cartilage acts as a cushion between the bones and helps them move smoothly. Osteoarthritis occurs when cartilage in the joints gets worn down. Osteoarthritis is sometimes called "wear and tear" arthritis. Osteoarthritis is the most common form of arthritis. It often occurs in older people. It is a condition that gets worse over time. The joints most often affected by this condition are in the fingers, toes, hips, knees, and spine, including the neck and lower back. What are the causes? This condition is caused by the wearing down of cartilage that covers the ends of bones. What increases the risk? The following factors may make you more likely to develop this condition:  Being age 50 or older.  Obesity.  Overuse of joints.  Past injury of a joint.  Past surgery on a joint.  Family history of osteoarthritis. What are the signs or symptoms? The main symptoms of this condition are pain, swelling, and stiffness in the joint. Other symptoms may include:  An enlarged joint.  More pain and further damage caused by small pieces of bone or cartilage that break off and float inside of the joint.  Small deposits of bone (osteophytes) that grow on the edges of the joint.  A grating or scraping feeling inside the joint when you move it.  Popping or creaking  sounds when you move.  Difficulty walking or exercising.  An inability to grip items, twist your hand(s), or control the movements of your hands and fingers. How is this diagnosed? This condition may be diagnosed based on:  Your medical history.  A physical exam.  Your symptoms.  X-rays of the affected joint(s).  Blood tests to rule out other types of arthritis. How is this treated? There is no cure for this condition, but treatment can help control pain and improve joint function. Treatment may include a combination of therapies, such as:  Pain relief techniques, such as: ? Applying heat and cold to the joint. ? Massage. ? A form of talk therapy called cognitive behavioral therapy (CBT). This therapy helps you set goals and follow up on the changes that you make.  Medicines for pain and inflammation. The medicines can be taken by mouth or applied to the skin. They include: ? NSAIDs, such as ibuprofen. ? Prescription medicines. ? Strong anti-inflammatory medicines (corticosteroids). ? Certain nutritional supplements.  A prescribed exercise program. You may work with a physical therapist.  Assistive devices, such as a brace, wrap, splint, specialized glove, or cane.  A weight control plan.  Surgery, such as: ? An osteotomy. This is done to reposition the bones and relieve pain or to remove loose pieces of bone and cartilage. ? Joint replacement surgery. You may need this surgery if you have advanced osteoarthritis. Follow these instructions at home: Activity  Rest your affected joints as told by your health  care provider.  Exercise as told by your health care provider. He or she may recommend specific types of exercise, such as: ? Strengthening exercises. These are done to strengthen the muscles that support joints affected by arthritis. ? Aerobic activities. These are exercises, such as brisk walking or water aerobics, that increase your heart rate. ? Range-of-motion  activities. These help your joints move more easily. ? Balance and agility exercises. Managing pain, stiffness, and swelling  If directed, apply heat to the affected area as often as told by your health care provider. Use the heat source that your health care provider recommends, such as a moist heat pack or a heating pad. ? If you have a removable assistive device, remove it as told by your health care provider. ? Place a towel between your skin and the heat source. If your health care provider tells you to keep the assistive device on while you apply heat, place a towel between the assistive device and the heat source. ? Leave the heat on for 20-30 minutes. ? Remove the heat if your skin turns bright red. This is especially important if you are unable to feel pain, heat, or cold. You may have a greater risk of getting burned.  If directed, put ice on the affected area. To do this: ? If you have a removable assistive device, remove it as told by your health care provider. ? Put ice in a plastic bag. ? Place a towel between your skin and the bag. If your health care provider tells you to keep the assistive device on during icing, place a towel between the assistive device and the bag. ? Leave the ice on for 20 minutes, 2-3 times a day. ? Move your fingers or toes often to reduce stiffness and swelling. ? Raise (elevate) the injured area above the level of your heart while you are sitting or lying down.      General instructions  Take over-the-counter and prescription medicines only as told by your health care provider.  Maintain a healthy weight. Follow instructions from your health care provider for weight control.  Do not use any products that contain nicotine or tobacco, such as cigarettes, e-cigarettes, and chewing tobacco. If you need help quitting, ask your health care provider.  Use assistive devices as told by your health care provider.  Keep all follow-up visits as told by your  health care provider. This is important. Where to find more information  [employer] of Arthritis and Musculoskeletal and Skin Diseases: www.niams.http://www.myers.net/  [employer] on Aging: https://walker.com/  American College of Rheumatology: www.rheumatology.org Contact a health care provider if:  You have redness, swelling, or a feeling of warmth in a joint that gets worse.  You have a fever along with joint or muscle aches.  You develop a rash.  You have trouble doing your normal activities. Get help right away if:  You have pain that gets worse and is not relieved by pain medicine. Summary  Osteoarthritis is a type of arthritis that affects tissue covering the ends of bones in joints (cartilage).  This condition is caused by the wearing down of cartilage that covers the ends of bones.  The main symptom of this condition is pain, swelling, and stiffness in the joint.  There is no cure for this condition, but treatment can help control pain and improve joint function. This information is not intended to replace advice given to you by your health care provider. Make sure you discuss any  questions you have with your health care provider. Document Revised: 06/17/2019 Document Reviewed: 06/17/2019 Elsevier Patient Education  2021 [employer].

## 2020-12-14 ENCOUNTER — Other Ambulatory Visit: Payer: Self-pay

## 2020-12-14 ENCOUNTER — Encounter: Payer: Self-pay | Admitting: Orthopedic Surgery

## 2020-12-14 ENCOUNTER — Ambulatory Visit: Payer: Medicare HMO | Admitting: Orthopedic Surgery

## 2020-12-14 VITALS — BP 120/76 | HR 72 | Ht 66.0 in | Wt 209.0 lb

## 2020-12-14 DIAGNOSIS — M1710 Unilateral primary osteoarthritis, unspecified knee: Secondary | ICD-10-CM

## 2020-12-14 DIAGNOSIS — M1711 Unilateral primary osteoarthritis, right knee: Secondary | ICD-10-CM | POA: Diagnosis not present

## 2020-12-14 DIAGNOSIS — M25561 Pain in right knee: Secondary | ICD-10-CM

## 2020-12-14 DIAGNOSIS — G8929 Other chronic pain: Secondary | ICD-10-CM

## 2020-12-14 NOTE — Progress Notes (Signed)
  Follow-up  Chief Complaint  Patient presents with   arthritis of knee    Right knee/not really feeling better/most of pain on inside of knee      70 year old female with osteoarthritis right knee received an injection last visit not much better.  She wants to wait to have surgery until wintertime  She is still very active riding horses and doing things in the summertime   Physical Exam Constitutional:      General: She is not in acute distress.    Appearance: She is well-developed.     Comments: Well developed, well nourished Normal grooming and hygiene     Cardiovascular:     Comments: No peripheral edema Skin:    General: Skin is warm and dry.  Neurological:     Mental Status: She is alert and oriented to person, place, and time.     Sensory: No sensory deficit.     Coordination: Coordination normal.     Gait: Gait normal.     Deep Tendon Reflexes: Reflexes are normal and symmetric.  Psychiatric:        Mood and Affect: Mood normal.        Behavior: Behavior normal.        Thought Content: Thought content normal.        Judgment: Judgment normal.     Comments: Affect normal     She maintains good knee range of motion but has medial joint line tenderness and slight varus deformity  History reviewed. No pertinent past medical history. Past Surgical History:  Procedure Laterality Date   EYE SURGERY     FOOT SURGERY     5 screws put in left foot   TOTAL KNEE ARTHROPLASTY     TUBAL LIGATION     Social History   Tobacco Use   Smoking status: Former    Pack years: 0.00   Smokeless tobacco: Never  Substance Use Topics   Alcohol use: No   Drug use: No   No orders of the defined types were placed in this encounter.    She will take Tylenol 500 mg every 6  Inject right knee  Call us when she is ready to have surgery  Procedure note right knee injection   verbal consent was obtained to inject right knee joint  Timeout was completed to confirm the  site of injection  The medications used were 40 MG DEPOMEDROL with 1% lidocaine 2 cc   anesthesia was provided by ethyl chloride and the skin was prepped with alcohol.  After cleaning the skin with alcohol a 20-gauge needle was used to inject the right knee joint. There were no complications. A sterile bandage was applied.

## 2020-12-14 NOTE — Patient Instructions (Signed)
WEIGHT LOSS  TYLENOL 500 MG EVERY 6 HRS   KNEE EXERCISES

## 2021-02-25 DIAGNOSIS — M25561 Pain in right knee: Secondary | ICD-10-CM | POA: Diagnosis not present

## 2021-02-25 DIAGNOSIS — M1711 Unilateral primary osteoarthritis, right knee: Secondary | ICD-10-CM | POA: Diagnosis not present

## 2021-04-02 DIAGNOSIS — M170 Bilateral primary osteoarthritis of knee: Secondary | ICD-10-CM | POA: Diagnosis not present

## 2021-04-02 DIAGNOSIS — Z139 Encounter for screening, unspecified: Secondary | ICD-10-CM | POA: Diagnosis not present

## 2021-04-02 DIAGNOSIS — Z6836 Body mass index (BMI) 36.0-36.9, adult: Secondary | ICD-10-CM | POA: Diagnosis not present

## 2021-04-05 DIAGNOSIS — Z791 Long term (current) use of non-steroidal anti-inflammatories (NSAID): Secondary | ICD-10-CM | POA: Diagnosis not present

## 2021-04-05 DIAGNOSIS — Z87891 Personal history of nicotine dependence: Secondary | ICD-10-CM | POA: Diagnosis not present

## 2021-04-05 DIAGNOSIS — Z6836 Body mass index (BMI) 36.0-36.9, adult: Secondary | ICD-10-CM | POA: Diagnosis not present

## 2021-04-05 DIAGNOSIS — Z8249 Family history of ischemic heart disease and other diseases of the circulatory system: Secondary | ICD-10-CM | POA: Diagnosis not present

## 2021-04-05 DIAGNOSIS — Z809 Family history of malignant neoplasm, unspecified: Secondary | ICD-10-CM | POA: Diagnosis not present

## 2021-04-05 DIAGNOSIS — Z825 Family history of asthma and other chronic lower respiratory diseases: Secondary | ICD-10-CM | POA: Diagnosis not present

## 2021-04-05 DIAGNOSIS — G8929 Other chronic pain: Secondary | ICD-10-CM | POA: Diagnosis not present

## 2021-04-05 DIAGNOSIS — R030 Elevated blood-pressure reading, without diagnosis of hypertension: Secondary | ICD-10-CM | POA: Diagnosis not present

## 2021-04-05 DIAGNOSIS — M1990 Unspecified osteoarthritis, unspecified site: Secondary | ICD-10-CM | POA: Diagnosis not present

## 2021-04-05 DIAGNOSIS — Z833 Family history of diabetes mellitus: Secondary | ICD-10-CM | POA: Diagnosis not present

## 2021-04-22 DIAGNOSIS — H524 Presbyopia: Secondary | ICD-10-CM | POA: Diagnosis not present

## 2021-04-26 NOTE — Progress Notes (Signed)
Sent message, via epic in basket, requesting orders in epic from surgeon.  

## 2021-05-06 ENCOUNTER — Other Ambulatory Visit: Payer: Self-pay | Admitting: Orthopedic Surgery

## 2021-05-06 DIAGNOSIS — M1710 Unilateral primary osteoarthritis, unspecified knee: Secondary | ICD-10-CM

## 2021-05-06 NOTE — Patient Instructions (Signed)
DUE TO COVID-19 ONLY ONE VISITOR IS ALLOWED TO COME WITH YOU AND STAY IN THE WAITING ROOM ONLY DURING PRE OP AND PROCEDURE.   **NO VISITORS ARE ALLOWED IN THE SHORT STAY AREA OR RECOVERY ROOM!!**  IF YOU WILL BE ADMITTED INTO THE HOSPITAL YOU ARE ALLOWED ONLY TWO SUPPORT PEOPLE DURING VISITATION HOURS ONLY (7 AM -8PM)   The support person(s) must pass our screening, gel in and out, and wear a mask at all times, including in the patient's room. Patients must also wear a mask when staff or their support person are in the room. Visitors GUEST BADGE MUST BE WORN VISIBLY  One adult visitor may remain with you overnight and MUST be in the room by 8 P.M.  No visitors under the age of 74. Any visitor under the age of 52 must be accompanied by an adult.    COVID SWAB TESTING MUST BE COMPLETED ON:  05/13/21 **MUST PRESENT COMPLETED FORM AT TESTING SITE**    [address] [place] (backside of the building) You are not required to quarantine, however you are required to wear a well-fitted mask when you are out and around people not in your household.  Hand Hygiene often Do NOT share personal items Notify your provider if you are in close contact with someone who has COVID or you develop fever 100.4 or greater, new onset of sneezing, cough, sore throat, shortness of breath or body aches.  [facility] [address], [address], must go inside of the hospital, NOT A DRIVE THRU!  (Must self quarantine after testing. Follow instructions on handout.)       Your procedure is scheduled on: 05/17/21   Report to [facility] Main Entr[facility]    Report to admitting at : 7:45 AM   Call this number if you have problems the morning of surgery [phone]   Do not eat food :After Midnight.   May have liquids until : 7:30 AM   day of surgery  CLEAR LIQUID DIET  Foods Allowed                                                                      Foods Excluded  Water, Black Coffee and tea, regular and decaf                             liquids that you cannot  Plain Jell-O in any flavor  (No red)                                           see through such as: Fruit ices (not with fruit pulp)                                     milk, soups, orange juice              Iced Popsicles (No red)  All solid food                                   Apple juices Sports drinks like Gatorade (No red) Lightly seasoned clear broth or consume(fat free) Sugar  Sample Menu Breakfast                                Lunch                                     Supper Cranberry juice                    Beef broth                            Chicken broth Jell-O                                     Grape juice                           Apple juice Coffee or tea                        Jell-O                                      Popsicle                                                Coffee or tea                        Coffee or tea      Complete one Ensure drink the morning of surgery at : 7:30 AM      the day of surgery.    The day of surgery:  Drink ONE (1) Pre-Surgery Clear Ensure or G2 by am the morning of surgery. Drink in one sitting. Do not sip.  This drink was given to you during your hospital  pre-op appointment visit. Nothing else to drink after completing the  Pre-Surgery Clear Ensure or G2.          If you have questions, please contact your surgeon's office.     Oral Hygiene is also important to reduce your risk of infection.                                    Remember - BRUSH YOUR TEETH THE MORNING OF SURGERY WITH YOUR REGULAR TOOTHPASTE   Do NOT smoke after Midnight   Take these medicines the morning of surgery with A SIP OF WATER:   DO NOT TAKE ANY ORAL DIABETIC MEDICATIONS DAY OF YOUR SURGERY  You may not have any metal on your body including hair pins, jewelry, and  body piercing             Do not wear make-up, lotions, powders, perfumes/cologne, or deodorant  Do not wear nail polish including gel and S&S, artificial/acrylic nails, or any other type of covering on natural nails including finger and toenails. If you have artificial nails, gel coating, etc. that needs to be removed by a nail salon please have this removed prior to surgery or surgery may need to be canceled/ delayed if the surgeon/ anesthesia feels like they are unable to be safely monitored.   Do not shave  48 hours prior to surgery.    Do not bring valuables to the hospital. [place].   Contacts, dentures or bridgework may not be worn into surgery.   Bring small overnight bag day of surgery.    Patients discharged on the day of surgery will not be allowed to drive home.   Special Instructions: Bring a copy of your healthcare power of attorney and living will documents         the day of surgery if you haven't scanned them before.              Please read over the following fact sheets you were given: IF YOU HAVE QUESTIONS ABOUT YOUR PRE-OP INSTRUCTIONS PLEASE CALL [phone]     [facility] - Preparing for Surgery Before surgery, you can play an important role.  Because skin is not sterile, your skin needs to be as free of germs as possible.  You can reduce the number of germs on your skin by washing with CHG (chlorahexidine gluconate) soap before surgery.  CHG is an antiseptic cleaner which kills germs and bonds with the skin to continue killing germs even after washing. Please DO NOT use if you have an allergy to CHG or antibacterial soaps.  If your skin becomes reddened/irritated stop using the CHG and inform your nurse when you arrive at Short Stay. Do not shave (including legs and underarms) for at least 48 hours prior to the first CHG shower.  You may shave your face/neck. Please follow these instructions carefully:  1.  Shower  with CHG Soap the night before surgery and the  morning of Surgery.  2.  If you choose to wash your hair, wash your hair first as usual with your  normal  shampoo.  3.  After you shampoo, rinse your hair and body thoroughly to remove the  shampoo.                           4.  Use CHG as you would any other liquid soap.  You can apply chg directly  to the skin and wash                       Gently with a scrungie or clean washcloth.  5.  Apply the CHG Soap to your body ONLY FROM THE NECK DOWN.   Do not use on face/ open                           Wound or open sores. Avoid contact with eyes, ears mouth and genitals (private parts).  Wash face,  Genitals (private parts) with your normal soap.             6.  Wash thoroughly, paying special attention to the area where your surgery  will be performed.  7.  Thoroughly rinse your body with warm water from the neck down.  8.  DO NOT shower/wash with your normal soap after using and rinsing off  the CHG Soap.                9.  Pat yourself dry with a clean towel.            10.  Wear clean pajamas.            11.  Place clean sheets on your bed the night of your first shower and do not  sleep with pets. Day of Surgery : Do not apply any lotions/deodorants the morning of surgery.  Please wear clean clothes to the hospital/surgery center.  FAILURE TO FOLLOW THESE INSTRUCTIONS MAY RESULT IN THE CANCELLATION OF YOUR SURGERY PATIENT SIGNATURE_________________________________  NURSE SIGNATURE__________________________________  ________________________________________________________________________   Adam Phenix  An incentive spirometer is a tool that can help keep your lungs clear and active. This tool measures how well you are filling your lungs with each breath. Taking long deep breaths may help reverse or decrease the chance of developing breathing (pulmonary) problems (especially infection) following: A long period of  time when you are unable to move or be active. BEFORE THE PROCEDURE  If the spirometer includes an indicator to show your best effort, your nurse or respiratory therapist will set it to a desired goal. If possible, sit up straight or lean slightly forward. Try not to slouch. Hold the incentive spirometer in an upright position. INSTRUCTIONS FOR USE  Sit on the edge of your bed if possible, or sit up as far as you can in bed or on a chair. Hold the incentive spirometer in an upright position. Breathe out normally. Place the mouthpiece in your mouth and seal your lips tightly around it. Breathe in slowly and as deeply as possible, raising the piston or the ball toward the top of the column. Hold your breath for 3-5 seconds or for as long as possible. Allow the piston or ball to fall to the bottom of the column. Remove the mouthpiece from your mouth and breathe out normally. Rest for a few seconds and repeat Steps 1 through 7 at least 10 times every 1-2 hours when you are awake. Take your time and take a few normal breaths between deep breaths. The spirometer may include an indicator to show your best effort. Use the indicator as a goal to work toward during each repetition. After each set of 10 deep breaths, practice coughing to be sure your lungs are clear. If you have an incision (the cut made at the time of surgery), support your incision when coughing by placing a pillow or rolled up towels firmly against it. Once you are able to get out of bed, walk around indoors and cough well. You may stop using the incentive spirometer when instructed by your caregiver.  RISKS AND COMPLICATIONS Take your time so you do not get dizzy or light-headed. If you are in pain, you may need to take or ask for pain medication before doing incentive spirometry. It is harder to take a deep breath if you are having pain. AFTER USE Rest and breathe slowly and easily. It can be helpful to keep track of  a log of your  progress. Your caregiver can provide you with a simple table to help with this. If you are using the spirometer at home, follow these instructions: [place] IF:  You are having difficultly using the spirometer. You have trouble using the spirometer as often as instructed. Your pain medication is not giving enough relief while using the spirometer. You develop fever of 100.5 F (38.1 C) or higher. SEEK IMMEDIATE MEDICAL CARE IF:  You cough up bloody sputum that had not been present before. You develop fever of 102 F (38.9 C) or greater. You develop worsening pain at or near the incision site. MAKE SURE YOU:  Understand these instructions. Will watch your condition. Will get help right away if you are not doing well or get worse. Document Released: 10/31/2006 Document Revised: 09/12/2011 Document Reviewed: 01/01/2007 [facility] Patient Information 2014 [place], [place].   ________________________________________________________________________

## 2021-05-07 ENCOUNTER — Other Ambulatory Visit: Payer: Self-pay

## 2021-05-07 ENCOUNTER — Encounter (HOSPITAL_COMMUNITY): Payer: Self-pay

## 2021-05-07 ENCOUNTER — Encounter (HOSPITAL_COMMUNITY)
Admission: RE | Admit: 2021-05-07 | Discharge: 2021-05-07 | Disposition: A | Discharge status: Home / Self Care | Payer: Medicare HMO | Source: Ambulatory Visit | Attending: Orthopedic Surgery | Admitting: Orthopedic Surgery

## 2021-05-07 VITALS — BP 157/83 | HR 64 | Temp 98.3°F | Resp 18 | Ht 63.0 in | Wt 199.1 lb

## 2021-05-07 DIAGNOSIS — Z01812 Encounter for preprocedural laboratory examination: Secondary | ICD-10-CM | POA: Insufficient documentation

## 2021-05-07 DIAGNOSIS — M1711 Unilateral primary osteoarthritis, right knee: Secondary | ICD-10-CM

## 2021-05-07 DIAGNOSIS — Z01818 Encounter for other preprocedural examination: Secondary | ICD-10-CM

## 2021-05-07 LAB — COMPREHENSIVE METABOLIC PANEL
ALT: 17 U/L (ref 0–44)
AST: 21 U/L (ref 15–41)
Albumin: 4 g/dL (ref 3.5–5.0)
Alkaline Phosphatase: 93 U/L (ref 38–126)
Anion gap: 6 (ref 5–15)
BUN: 18 mg/dL (ref 8–23)
CO2: 28 mmol/L (ref 22–32)
Calcium: 9.3 mg/dL (ref 8.9–10.3)
Chloride: 105 mmol/L (ref 98–111)
Creatinine, Ser: 0.92 mg/dL (ref 0.44–1.00)
GFR, Estimated: >60 mL/min (ref >60)
Glucose, Bld: 98 mg/dL (ref 70–99)
Potassium: 4.4 mmol/L (ref 3.5–5.1)
Sodium: 139 mmol/L (ref 135–145)
Total Bilirubin: 0.7 mg/dL (ref 0.3–1.2)
Total Protein: 7.4 g/dL (ref 6.5–8.1)

## 2021-05-07 LAB — CBC
HCT: 37.7 % (ref 36.0–46.0)
Hemoglobin: 12.4 g/dL (ref 12.0–15.0)
MCH: 30.7 pg (ref 26.0–34.0)
MCHC: 32.9 g/dL (ref 30.0–36.0)
MCV: 93.3 fL (ref 80.0–100.0)
Platelets: 229 10*3/uL (ref 150–400)
RBC: 4.04 MIL/uL (ref 3.87–5.11)
RDW: 12.5 % (ref 11.5–15.5)
WBC: 4.3 10*3/uL (ref 4.0–10.5)
nRBC: 0 % (ref 0.0–0.2)

## 2021-05-07 LAB — SURGICAL PCR SCREEN
MRSA, PCR: NEGATIVE
Staphylococcus aureus: NEGATIVE

## 2021-05-07 LAB — PROTIME-INR
INR: 1 (ref 0.8–1.2)
Prothrombin Time: 12.7 seconds (ref 11.4–15.2)

## 2021-05-07 NOTE — Progress Notes (Addendum)
COVID Vaccine Completed: YES Date COVID Vaccine completed: 2021 x 2 COVID vaccine manufacturer:  Moderna    COVID Test: 05/13/21 PCP - Dr. Burnell Blanks Hyler: Sidney Ace  Cardiologist -   Chest x-ray -  EKG -  Stress Test -  ECHO -  Cardiac Cath -  Pacemaker/ICD device last checked:  Sleep Study -  CPAP -   Fasting Blood Sugar -  Checks Blood Sugar _____ times a day  Blood Thinner Instructions: Aspirin Instructions: Last Dose:  Anesthesia review:   Patient denies shortness of breath, fever, cough and chest pain at PAT appointment   Patient verbalized understanding of instructions that were given to them at the PAT appointment. Patient was also instructed that they will need to review over the PAT instructions again at home before surgery.

## 2021-05-13 ENCOUNTER — Other Ambulatory Visit: Payer: Self-pay | Admitting: Orthopedic Surgery

## 2021-05-13 LAB — SARS CORONAVIRUS 2 (TAT 6-24 HRS): SARS Coronavirus 2: NEGATIVE

## 2021-05-16 NOTE — H&P (Signed)
TOTAL KNEE ADMISSION H&P  Patient is being admitted for right total knee arthroplasty.  Subjective:  Chief Complaint: Right knee pain.  HPI: [name], 70 y.o. female has a history of pain and functional disability in the right knee due to arthritis and has failed non-surgical conservative treatments for greater than 12 weeks to include NSAID's and/or analgesics, flexibility and strengthening excercises, and activity modification. Onset of symptoms was gradual, starting  several  years ago with gradually worsening course since that time. The patient noted no past surgery on the right knee.  Patient currently rates pain in the right knee at 6 out of 10 with activity. Patient has worsening of pain with activity and weight bearing, pain that interferes with activities of daily living, pain with passive range of motion, and crepitus. Patient has evidence of periarticular osteophytes and joint space narrowing by imaging studies. There is no active infection.  There are no problems to display for this patient.   No past medical history on file.  Past Surgical History:  Procedure Laterality Date   EYE SURGERY     FOOT SURGERY     5 screws put in left foot   TOTAL KNEE ARTHROPLASTY     TUBAL LIGATION      Prior to Admission medications   Medication Sig Start Date End Date Taking? Authorizing Provider  meloxicam (MOBIC) 7.5 MG tablet TAKE 1 TABLET BY MOUTH EVERY DAY 05/06/21   Vickki Hearing, MD    No Known Allergies  Social History   Socioeconomic History   Marital status: Married    Spouse name: Not on file   Number of children: Not on file   Years of education: Not on file   Highest education level: Not on file  Occupational History   Not on file  Tobacco Use   Smoking status: Former   Smokeless tobacco: Never  Vaping Use   Vaping Use: Never used  Substance and Sexual Activity   Alcohol use: Yes    Comment: socially   Drug use: No   Sexual activity: Yes  Other  Topics Concern   Not on file  Social History Narrative   Not on file   Social Determinants of Health   Financial Resource Strain: Not on file  Food Insecurity: Not on file  Transportation Needs: Not on file  Physical Activity: Not on file  Stress: Not on file  Social Connections: Not on file  Intimate Partner Violence: Not on file    Tobacco Use: Medium Risk   Smoking Tobacco Use: Former   Smokeless Tobacco Use: Never   Passive Exposure: Not on file   Social History   Substance and Sexual Activity  Alcohol Use Yes   Comment: socially    Family History  Problem Relation Age of Onset   Heart disease Mother    Hypertension Mother    Diabetes Mother    Diabetes Maternal Grandmother    Diabetes Maternal Grandfather     ROS: Constitutional: no fever, no chills, no night sweats, no significant weight loss Cardiovascular: no chest pain, no palpitations Respiratory: no cough, no shortness of breath, No COPD Gastrointestinal: no vomiting, no nausea Musculoskeletal: no swelling in Joints, Joint Pain Neurologic: no numbness, no tingling, no difficulty with balance   Objective:  Physical Exam: Well nourished and well developed.  General: Alert and oriented x3, cooperative and pleasant, no acute distress.  Head: normocephalic, atraumatic, neck supple.  Eyes: EOMI.  Respiratory: breath sounds clear in  all fields, no wheezing, rales, or rhonchi. Cardiovascular: Regular rate and rhythm, no murmurs, gallops or rubs.  Abdomen: non-tender to palpation and soft, normoactive bowel sounds. Musculoskeletal:  Antalgic gait pattern favoring the right side without the use of assisted devices.   Bilateral Hip Exam:  ROM: Normal without discomfort.   Right Knee Exam:  No effusion.  Range of motion is 5 to 110 degrees.  Positive crepitus on range of motion of the knee.  Positive medial joint line tenderness.  No lateral joint line tenderness.  Stable knee.   Left Knee Exam:   Well-healed scar from previous arthroplasty.  No effusion.  Range of motion is 0 to 125 degrees.  No crepitus on range of motion of the knee.  No medial joint line tenderness.  No lateral joint line tenderness.  The knee is stable.   Calves soft and nontender. Motor function intact in LE. Strength 5/5 LE bilaterally. Neuro: Distal pulses 2+. Sensation to light touch intact in LE.   Vital signs in last 24 hours:    Imaging Review Radiographs- AP and lateral of the right knee on the Canopy system demonstrate bone-on-bone arthritis in the medial and patellofemoral compartments.  Assessment/Plan:  End stage arthritis, right knee   The patient history, physical examination, clinical judgment of the provider and imaging studies are consistent with end stage degenerative joint disease of the right knee and total knee arthroplasty is deemed medically necessary. The treatment options including medical management, injection therapy arthroscopy and arthroplasty were discussed at length. The risks and benefits of total knee arthroplasty were presented and reviewed. The risks due to aseptic loosening, infection, stiffness, patella tracking problems, thromboembolic complications and other imponderables were discussed. The patient acknowledged the explanation, agreed to proceed with the plan and consent was signed. Patient is being admitted for inpatient treatment for surgery, pain control, PT, OT, prophylactic antibiotics, VTE prophylaxis, progressive ambulation and ADLs and discharge planning. The patient is planning to be discharged  home .   Patient's anticipated LOS is less than 2 midnights, meeting these requirements: - Lives within 1 hour of care - Has a competent adult at home to recover with post-op - NO history of  - Chronic pain requiring opioids  - Diabetes  - Coronary Artery Disease  - Heart failure  - Heart attack  - Stroke  - DVT/VTE  - Cardiac arrhythmia  - Respiratory  Failure/COPD  - Renal failure  - Anemia  - Advanced Liver disease   Therapy Plans: [facility] In [place] Disposition: Home with  Planned DVT Prophylaxis: Aspirin 325mg   DME Needed: RW PCP: , PA-C (Patient contacting for clearance) TXA: IV Allergies: None Anesthesia Concerns: None BMI: 36.1 Last HgbA1c: N/A  Pharmacy: [facility] [place]   - Patient was instructed on what medications to stop prior to surgery. - Follow-up visit in 2 weeks with Dr. 200 - Begin physical therapy following surgery - Pre-operative lab work as pre-surgical testing - Prescriptions will be provided in hospital at time of discharge  Lequita Halt, [facility], PA-C Orthopedic Surgery [facility] [place]

## 2021-05-17 ENCOUNTER — Encounter (HOSPITAL_COMMUNITY)
Admission: RE | Disposition: A | Discharge status: Home / Self Care | Payer: Self-pay | Source: Ambulatory Visit | Attending: Orthopedic Surgery

## 2021-05-17 ENCOUNTER — Observation Stay (HOSPITAL_COMMUNITY)
Admission: RE | Admit: 2021-05-17 | Discharge: 2021-05-19 | Disposition: A | Discharge status: Home / Self Care | Payer: Medicare HMO | Source: Ambulatory Visit | Attending: Orthopedic Surgery | Admitting: Orthopedic Surgery

## 2021-05-17 ENCOUNTER — Other Ambulatory Visit: Payer: Self-pay

## 2021-05-17 ENCOUNTER — Ambulatory Visit (HOSPITAL_COMMUNITY): Payer: Medicare HMO | Admitting: Anesthesiology

## 2021-05-17 ENCOUNTER — Encounter (HOSPITAL_COMMUNITY): Payer: Self-pay | Admitting: Orthopedic Surgery

## 2021-05-17 DIAGNOSIS — G8918 Other acute postprocedural pain: Secondary | ICD-10-CM | POA: Diagnosis not present

## 2021-05-17 DIAGNOSIS — M1711 Unilateral primary osteoarthritis, right knee: Principal | ICD-10-CM | POA: Diagnosis present

## 2021-05-17 DIAGNOSIS — Z7982 Long term (current) use of aspirin: Secondary | ICD-10-CM | POA: Diagnosis not present

## 2021-05-17 DIAGNOSIS — Z87891 Personal history of nicotine dependence: Secondary | ICD-10-CM | POA: Insufficient documentation

## 2021-05-17 DIAGNOSIS — Z79899 Other long term (current) drug therapy: Secondary | ICD-10-CM | POA: Insufficient documentation

## 2021-05-17 DIAGNOSIS — M179 Osteoarthritis of knee, unspecified: Secondary | ICD-10-CM | POA: Diagnosis present

## 2021-05-17 HISTORY — PX: TOTAL KNEE ARTHROPLASTY: SHX125

## 2021-05-17 SURGERY — ARTHROPLASTY, KNEE, TOTAL
Anesthesia: Spinal | Site: Knee | Laterality: Right

## 2021-05-17 MED ORDER — DEXAMETHASONE SODIUM PHOSPHATE 10 MG/ML IJ SOLN: 8.0000 mg | Freq: Once | INTRAMUSCULAR | Status: DC

## 2021-05-17 MED ORDER — ACETAMINOPHEN 10 MG/ML IV SOLN
1000.0000 mg | Freq: Four times a day (QID) | INTRAVENOUS | Status: DC
Start: 2021-05-17 — End: 2021-05-17
  Administered 2021-05-17: 1000 mg via INTRAVENOUS

## 2021-05-17 MED ORDER — LIDOCAINE 2% (20 MG/ML) 5 ML SYRINGE
INTRAMUSCULAR | Status: DC | PRN
  Administered 2021-05-17: 60 mg via INTRAVENOUS

## 2021-05-17 MED ORDER — GABAPENTIN 300 MG PO CAPS
300.0000 mg | ORAL_CAPSULE | Freq: Three times a day (TID) | ORAL | Status: DC
  Administered 2021-05-17 – 2021-05-19 (×6): 300 mg via ORAL
  Filled 2021-05-17 (×6): qty 1

## 2021-05-17 MED ORDER — DIPHENHYDRAMINE HCL 12.5 MG/5ML PO ELIX: 12.5000 mg | ORAL_SOLUTION | ORAL | Status: DC | PRN

## 2021-05-17 MED ORDER — MIDAZOLAM HCL 2 MG/2ML IJ SOLN
1.0000 mg | INTRAMUSCULAR | Status: DC
  Administered 2021-05-17: 1 mg via INTRAVENOUS

## 2021-05-17 MED ORDER — TRANEXAMIC ACID-NACL 1000-0.7 MG/100ML-% IV SOLN
INTRAVENOUS | Status: AC
  Filled 2021-05-17: qty 100

## 2021-05-17 MED ORDER — TRANEXAMIC ACID-NACL 1000-0.7 MG/100ML-% IV SOLN
1000.0000 mg | INTRAVENOUS | Status: AC
  Administered 2021-05-17: 1000 mg via INTRAVENOUS

## 2021-05-17 MED ORDER — ASPIRIN EC 325 MG PO TBEC
325.0000 mg | DELAYED_RELEASE_TABLET | Freq: Two times a day (BID) | ORAL | Status: DC
  Administered 2021-05-18 – 2021-05-19 (×3): 325 mg via ORAL
  Filled 2021-05-17 (×3): qty 1

## 2021-05-17 MED ORDER — DOCUSATE SODIUM 100 MG PO CAPS
100.0000 mg | ORAL_CAPSULE | Freq: Two times a day (BID) | ORAL | Status: DC
  Administered 2021-05-17 – 2021-05-19 (×4): 100 mg via ORAL
  Filled 2021-05-17 (×4): qty 1

## 2021-05-17 MED ORDER — LACTATED RINGERS IV SOLN: INTRAVENOUS | Status: DC

## 2021-05-17 MED ORDER — OXYCODONE HCL 5 MG PO TABS: 5.0000 mg | ORAL_TABLET | Freq: Once | ORAL | Status: DC | PRN

## 2021-05-17 MED ORDER — PHENOL 1.4 % MT LIQD: 1.0000 | OROMUCOSAL | Status: DC | PRN

## 2021-05-17 MED ORDER — MORPHINE SULFATE (PF) 2 MG/ML IV SOLN
0.5000 mg | INTRAVENOUS | Status: DC | PRN
  Administered 2021-05-17 – 2021-05-18 (×3): 1 mg via INTRAVENOUS
  Filled 2021-05-17 (×3): qty 1

## 2021-05-17 MED ORDER — METOCLOPRAMIDE HCL 5 MG PO TABS: 5.0000 mg | ORAL_TABLET | Freq: Three times a day (TID) | ORAL | Status: DC | PRN

## 2021-05-17 MED ORDER — SODIUM CHLORIDE (PF) 0.9 % IJ SOLN
INTRAMUSCULAR | Status: DC | PRN
  Administered 2021-05-17: 60 mL

## 2021-05-17 MED ORDER — BUPIVACAINE LIPOSOME 1.3 % IJ SUSP
INTRAMUSCULAR | Status: AC
  Filled 2021-05-17: qty 20

## 2021-05-17 MED ORDER — DEXAMETHASONE SODIUM PHOSPHATE 10 MG/ML IJ SOLN
10.0000 mg | Freq: Once | INTRAMUSCULAR | Status: AC
  Administered 2021-05-18: 10 mg via INTRAVENOUS
  Filled 2021-05-17: qty 1

## 2021-05-17 MED ORDER — CEFAZOLIN SODIUM-DEXTROSE 2-4 GM/100ML-% IV SOLN
2.0000 g | Freq: Four times a day (QID) | INTRAVENOUS | Status: AC
  Administered 2021-05-17 (×2): 2 g via INTRAVENOUS
  Filled 2021-05-17 (×2): qty 100

## 2021-05-17 MED ORDER — CHLORHEXIDINE GLUCONATE 0.12 % MT SOLN
15.0000 mL | Freq: Once | OROMUCOSAL | Status: AC
  Administered 2021-05-17: 15 mL via OROMUCOSAL

## 2021-05-17 MED ORDER — METOCLOPRAMIDE HCL 5 MG/ML IJ SOLN: 5.0000 mg | Freq: Three times a day (TID) | INTRAMUSCULAR | Status: DC | PRN

## 2021-05-17 MED ORDER — OXYCODONE HCL 5 MG PO TABS
5.0000 mg | ORAL_TABLET | ORAL | Status: DC | PRN
  Administered 2021-05-17 – 2021-05-19 (×11): 10 mg via ORAL
  Filled 2021-05-17 (×11): qty 2

## 2021-05-17 MED ORDER — METHOCARBAMOL 500 MG PO TABS
500.0000 mg | ORAL_TABLET | Freq: Four times a day (QID) | ORAL | Status: DC | PRN
  Administered 2021-05-17 – 2021-05-18 (×2): 500 mg via ORAL
  Filled 2021-05-17 (×2): qty 1

## 2021-05-17 MED ORDER — HYDROMORPHONE HCL 1 MG/ML IJ SOLN: 0.2500 mg | INTRAMUSCULAR | Status: DC | PRN

## 2021-05-17 MED ORDER — POVIDONE-IODINE 10 % EX SWAB
2.0000 "application " | Freq: Once | CUTANEOUS | Status: AC
  Administered 2021-05-17: 2 via TOPICAL

## 2021-05-17 MED ORDER — MIDAZOLAM HCL 2 MG/2ML IJ SOLN
INTRAMUSCULAR | Status: AC
  Filled 2021-05-17: qty 2

## 2021-05-17 MED ORDER — METHOCARBAMOL 500 MG IVPB - SIMPLE MED
500.0000 mg | Freq: Four times a day (QID) | INTRAVENOUS | Status: DC | PRN
  Filled 2021-05-17: qty 50

## 2021-05-17 MED ORDER — BISACODYL 10 MG RE SUPP: 10.0000 mg | Freq: Every day | RECTAL | Status: DC | PRN

## 2021-05-17 MED ORDER — TRAMADOL HCL 50 MG PO TABS
50.0000 mg | ORAL_TABLET | Freq: Four times a day (QID) | ORAL | Status: DC | PRN
  Administered 2021-05-18: 100 mg via ORAL
  Filled 2021-05-17: qty 2

## 2021-05-17 MED ORDER — SODIUM CHLORIDE 0.9 % IR SOLN
Status: DC | PRN
  Administered 2021-05-17: 1000 mL

## 2021-05-17 MED ORDER — FENTANYL CITRATE PF 50 MCG/ML IJ SOSY
PREFILLED_SYRINGE | INTRAMUSCULAR | Status: AC
  Filled 2021-05-17: qty 2

## 2021-05-17 MED ORDER — CEFAZOLIN SODIUM-DEXTROSE 2-4 GM/100ML-% IV SOLN
INTRAVENOUS | Status: AC
  Filled 2021-05-17: qty 100

## 2021-05-17 MED ORDER — BUPIVACAINE LIPOSOME 1.3 % IJ SUSP: 20.0000 mL | Freq: Once | INTRAMUSCULAR | Status: DC

## 2021-05-17 MED ORDER — SODIUM CHLORIDE 0.9 % IV SOLN: INTRAVENOUS | Status: DC

## 2021-05-17 MED ORDER — SODIUM CHLORIDE (PF) 0.9 % IJ SOLN
INTRAMUSCULAR | Status: AC
  Filled 2021-05-17: qty 10

## 2021-05-17 MED ORDER — POLYETHYLENE GLYCOL 3350 17 G PO PACK: 17.0000 g | PACK | Freq: Every day | ORAL | Status: DC | PRN

## 2021-05-17 MED ORDER — PROPOFOL 500 MG/50ML IV EMUL
INTRAVENOUS | Status: DC | PRN
  Administered 2021-05-17: 100 ug/kg/min via INTRAVENOUS

## 2021-05-17 MED ORDER — CEFAZOLIN SODIUM-DEXTROSE 2-4 GM/100ML-% IV SOLN
2.0000 g | INTRAVENOUS | Status: AC
  Administered 2021-05-17: 2 g via INTRAVENOUS

## 2021-05-17 MED ORDER — ROPIVACAINE HCL 5 MG/ML IJ SOLN
INTRAMUSCULAR | Status: DC | PRN
  Administered 2021-05-17: 20 mL via PERINEURAL

## 2021-05-17 MED ORDER — BUPIVACAINE IN DEXTROSE 0.75-8.25 % IT SOLN
INTRATHECAL | Status: DC | PRN
  Administered 2021-05-17: 1.4 mL via INTRATHECAL

## 2021-05-17 MED ORDER — ACETAMINOPHEN 500 MG PO TABS
1000.0000 mg | ORAL_TABLET | Freq: Four times a day (QID) | ORAL | Status: AC
  Administered 2021-05-17 – 2021-05-18 (×4): 1000 mg via ORAL
  Filled 2021-05-17 (×4): qty 2

## 2021-05-17 MED ORDER — ACETAMINOPHEN 10 MG/ML IV SOLN
INTRAVENOUS | Status: AC
  Filled 2021-05-17: qty 100

## 2021-05-17 MED ORDER — FLEET ENEMA 7-19 GM/118ML RE ENEM: 1.0000 | ENEMA | Freq: Once | RECTAL | Status: DC | PRN

## 2021-05-17 MED ORDER — ORAL CARE MOUTH RINSE: 15.0000 mL | Freq: Once | OROMUCOSAL | Status: AC

## 2021-05-17 MED ORDER — ONDANSETRON HCL 4 MG PO TABS: 4.0000 mg | ORAL_TABLET | Freq: Four times a day (QID) | ORAL | Status: DC | PRN

## 2021-05-17 MED ORDER — ACETAMINOPHEN 10 MG/ML IV SOLN: 1000.0000 mg | Freq: Once | INTRAVENOUS | Status: DC | PRN

## 2021-05-17 MED ORDER — DEXAMETHASONE SODIUM PHOSPHATE 10 MG/ML IJ SOLN
INTRAMUSCULAR | Status: DC | PRN
  Administered 2021-05-17: 10 mg via INTRAVENOUS

## 2021-05-17 MED ORDER — ONDANSETRON HCL 4 MG/2ML IJ SOLN
INTRAMUSCULAR | Status: DC | PRN
  Administered 2021-05-17: 4 mg via INTRAVENOUS

## 2021-05-17 MED ORDER — 0.9 % SODIUM CHLORIDE (POUR BTL) OPTIME
TOPICAL | Status: DC | PRN
  Administered 2021-05-17: 1000 mL

## 2021-05-17 MED ORDER — ONDANSETRON HCL 4 MG/2ML IJ SOLN: 4.0000 mg | Freq: Once | INTRAMUSCULAR | Status: DC | PRN

## 2021-05-17 MED ORDER — BUPIVACAINE LIPOSOME 1.3 % IJ SUSP
INTRAMUSCULAR | Status: DC | PRN
  Administered 2021-05-17: 20 mL

## 2021-05-17 MED ORDER — FENTANYL CITRATE PF 50 MCG/ML IJ SOSY
50.0000 ug | PREFILLED_SYRINGE | INTRAMUSCULAR | Status: DC
  Administered 2021-05-17: 50 ug via INTRAVENOUS

## 2021-05-17 MED ORDER — OXYCODONE HCL 5 MG/5ML PO SOLN: 5.0000 mg | Freq: Once | ORAL | Status: DC | PRN

## 2021-05-17 MED ORDER — MENTHOL 3 MG MT LOZG: 1.0000 | LOZENGE | OROMUCOSAL | Status: DC | PRN

## 2021-05-17 MED ORDER — ONDANSETRON HCL 4 MG/2ML IJ SOLN: 4.0000 mg | Freq: Four times a day (QID) | INTRAMUSCULAR | Status: DC | PRN

## 2021-05-17 SURGICAL SUPPLY — 60 items
ATTUNE MED DOME PAT 38 KNEE (Knees) ×2 IMPLANT
ATTUNE MED DOME PAT 38MM KNEE (Knees) ×1 IMPLANT
ATTUNE PS FEM RT SZ 7 CEM KNEE (Femur) ×3 IMPLANT
ATTUNE PSRP INSR SZ7 8 KNEE (Insert) ×2 IMPLANT
ATTUNE PSRP INSR SZ7 8MM KNEE (Insert) ×1 IMPLANT
BAG COUNTER SPONGE SURGICOUNT (BAG) IMPLANT
BAG SPEC THK2 15X12 ZIP CLS (MISCELLANEOUS) ×1
BAG SPNG CNTER NS LX DISP (BAG)
BAG SURGICOUNT SPONGE COUNTING (BAG)
BAG ZIPLOCK 12X15 (MISCELLANEOUS) ×3 IMPLANT
BASE TIBIA ATTUNE KNEE SYS SZ6 (Knees) ×1 IMPLANT
BLADE SAG 18X100X1.27 (BLADE) ×3 IMPLANT
BLADE SAW SGTL 11.0X1.19X90.0M (BLADE) ×3 IMPLANT
BNDG CMPR MED 10X6 ELC LF (GAUZE/BANDAGES/DRESSINGS) ×1
BNDG ELASTIC 6X10 VLCR STRL LF (GAUZE/BANDAGES/DRESSINGS) ×3 IMPLANT
BNDG ELASTIC 6X5.8 VLCR STR LF (GAUZE/BANDAGES/DRESSINGS) ×3 IMPLANT
BOWL SMART MIX CTS (DISPOSABLE) ×3 IMPLANT
BSPLAT TIB 6 CMNT ROT PLAT STR (Knees) ×1 IMPLANT
CEMENT HV SMART SET (Cement) ×6 IMPLANT
CLOSURE WOUND 1/2 X4 (GAUZE/BANDAGES/DRESSINGS) ×2
COVER SURGICAL LIGHT HANDLE (MISCELLANEOUS) ×3 IMPLANT
CUFF TOURN SGL QUICK 34 (TOURNIQUET CUFF) ×3
CUFF TRNQT CYL 34X4.125X (TOURNIQUET CUFF) ×1 IMPLANT
DECANTER SPIKE VIAL GLASS SM (MISCELLANEOUS) ×3 IMPLANT
DRAPE INCISE IOBAN 66X45 STRL (DRAPES) ×3 IMPLANT
DRAPE U-SHAPE 47X51 STRL (DRAPES) ×3 IMPLANT
DRSG AQUACEL AG ADV 3.5X10 (GAUZE/BANDAGES/DRESSINGS) ×3 IMPLANT
DURAPREP 26ML APPLICATOR (WOUND CARE) ×3 IMPLANT
ELECT REM PT RETURN 15FT ADLT (MISCELLANEOUS) ×3 IMPLANT
GLOVE SRG 8 PF TXTR STRL LF DI (GLOVE) ×1 IMPLANT
GLOVE SURG ENC MOIS LTX SZ6.5 (GLOVE) ×3 IMPLANT
GLOVE SURG ENC MOIS LTX SZ8 (GLOVE) ×6 IMPLANT
GLOVE SURG UNDER POLY LF SZ7 (GLOVE) ×3 IMPLANT
GLOVE SURG UNDER POLY LF SZ8 (GLOVE) ×3
GLOVE SURG UNDER POLY LF SZ8.5 (GLOVE) ×3 IMPLANT
GOWN STRL REUS W/TWL LRG LVL3 (GOWN DISPOSABLE) ×6 IMPLANT
GOWN STRL REUS W/TWL XL LVL3 (GOWN DISPOSABLE) ×3 IMPLANT
HANDPIECE INTERPULSE COAX TIP (DISPOSABLE) ×3
HOLDER FOLEY CATH W/STRAP (MISCELLANEOUS) IMPLANT
IMMOBILIZER KNEE 20 (SOFTGOODS) ×3
IMMOBILIZER KNEE 20 THIGH 36 (SOFTGOODS) ×1 IMPLANT
KIT TURNOVER KIT A (KITS) IMPLANT
MANIFOLD NEPTUNE II (INSTRUMENTS) ×3 IMPLANT
NS IRRIG 1000ML POUR BTL (IV SOLUTION) ×3 IMPLANT
PACK TOTAL KNEE CUSTOM (KITS) ×3 IMPLANT
PADDING CAST COTTON 6X4 STRL (CAST SUPPLIES) ×3 IMPLANT
PROTECTOR NERVE ULNAR (MISCELLANEOUS) ×3 IMPLANT
SET HNDPC FAN SPRY TIP SCT (DISPOSABLE) ×1 IMPLANT
STRIP CLOSURE SKIN 1/2X4 (GAUZE/BANDAGES/DRESSINGS) ×4 IMPLANT
SUT MNCRL AB 4-0 PS2 18 (SUTURE) ×3 IMPLANT
SUT STRATAFIX 0 PDS 27 VIOLET (SUTURE) ×3
SUT VIC AB 2-0 CT1 27 (SUTURE) ×9
SUT VIC AB 2-0 CT1 TAPERPNT 27 (SUTURE) ×3 IMPLANT
SUTURE STRATFX 0 PDS 27 VIOLET (SUTURE) ×1 IMPLANT
TAPE STRIPS DRAPE STRL (GAUZE/BANDAGES/DRESSINGS) ×3 IMPLANT
TIBIA ATTUNE KNEE SYS BASE SZ6 (Knees) ×3 IMPLANT
TRAY FOLEY MTR SLVR 16FR STAT (SET/KITS/TRAYS/PACK) ×3 IMPLANT
TUBE SUCTION HIGH CAP CLEAR NV (SUCTIONS) ×3 IMPLANT
WATER STERILE IRR 1000ML POUR (IV SOLUTION) ×6 IMPLANT
WRAP KNEE MAXI GEL POST OP (GAUZE/BANDAGES/DRESSINGS) ×3 IMPLANT

## 2021-05-17 NOTE — Anesthesia Procedure Notes (Signed)
Spinal  Patient location during procedure: OR Start time: 05/17/2021 10:32 AM Reason for block: surgical anesthesia Staffing Performed: resident/CRNA  Anesthesiologist: Myrtie Soman, MD Resident/CRNA: Gerald Leitz, CRNA Preanesthetic Checklist Completed: patient identified, IV checked, site marked, risks and benefits discussed, surgical consent, monitors and equipment checked, pre-op evaluation and timeout performed Spinal Block Patient position: sitting Prep: Betadine Patient monitoring: heart rate, continuous pulse ox, blood pressure and cardiac monitor Approach: midline Location: L3-4 Injection technique: single-shot Needle Needle type: Introducer and Pencan  Needle gauge: 24 G Needle length: 9 cm Assessment Sensory level: T4 Events: CSF return Additional Notes Negative paresthesia. Negative blood return. Positive free-flowing CSF. Expiration date of kit checked and confirmed. Patient tolerated procedure well, without complications.

## 2021-05-17 NOTE — Anesthesia Preprocedure Evaluation (Addendum)
Anesthesia Evaluation  Patient identified by MRN, date of birth, ID band Patient awake    Reviewed: Allergy & Precautions, NPO status , Patient's Chart, lab work & pertinent test results  Airway Mallampati: II  TM Distance: >3 FB Neck ROM: Full    Dental  (+) Missing, Loose, Poor Dentition, Chipped,  Very poor dentition:   Pulmonary neg pulmonary ROS, former smoker,    Pulmonary exam normal breath sounds clear to auscultation       Cardiovascular negative cardio ROS Normal cardiovascular exam Rhythm:Regular Rate:Normal     Neuro/Psych negative neurological ROS  negative psych ROS   GI/Hepatic negative GI ROS, Neg liver ROS,   Endo/Other  negative endocrine ROS  Renal/GU negative Renal ROS  negative genitourinary   Musculoskeletal  (+) Arthritis , Osteoarthritis,    Abdominal   Peds negative pediatric ROS (+)  Hematology negative hematology ROS (+)   Anesthesia Other Findings   Reproductive/Obstetrics negative OB ROS                            Anesthesia Physical Anesthesia Plan  ASA: 2  Anesthesia Plan: Spinal   Post-op Pain Management:  Regional for Post-op pain   Induction: Intravenous  PONV Risk Score and Plan: 2 and Ondansetron, Dexamethasone, Propofol infusion and Treatment may vary due to age or medical condition  Airway Management Planned: Simple Face Mask  Additional Equipment:   Intra-op Plan:   Post-operative Plan:   Informed Consent: I have reviewed the patients History and Physical, chart, labs and discussed the procedure including the risks, benefits and alternatives for the proposed anesthesia with the patient or authorized representative who has indicated his/her understanding and acceptance.     Dental advisory given  Plan Discussed with: CRNA and Surgeon  Anesthesia Plan Comments:         Anesthesia Quick Evaluation

## 2021-05-17 NOTE — Anesthesia Procedure Notes (Signed)
Anesthesia Procedure Image    

## 2021-05-17 NOTE — Care Plan (Signed)
Ortho Bundle Case Management Note  Patient Details  Name: MIRA BALON MRN: [id number] Date of Birth: 08-24-1950  R TKA on 05-17-21 DCP:  Home with boyfriend.  1 story home with 3 ste.  DME:  RW ordered through [employer].  Has a 3-in-1. PT:  [facility] on 05-20-21.                   DME Arranged:  Dan Humphreys rolling DME Agency:  [employer]  HH Arranged:  NA HH Agency:  NA  Additional Comments: Please contact me with any questions of if this plan should need to change.  Ennis Forts, RN,CCM [facility]  [phone] 05/17/2021, 3:15 PM

## 2021-05-17 NOTE — Progress Notes (Signed)
Orthopedic Tech Progress Note Patient Details:  [name] December 26, 1950 [id number]  CPM Right Knee CPM Right Knee: On Right Knee Flexion (Degrees): 40 Right Knee Extension (Degrees): 10  Post Interventions Patient Tolerated: Well Instructions Provided: Care of device, Adjustment of device  Saul Fordyce 05/17/2021, 12:12 PM

## 2021-05-17 NOTE — Progress Notes (Signed)
Assisted Dr.George Rose with  Right Knee Adductor Canal block. Side rails up, monitors on throughout procedure. See vital signs in flow sheet. Tolerated Procedure well.  

## 2021-05-17 NOTE — Discharge Instructions (Signed)
 Frank Aluisio, MD Total Joint Specialist [facility] [address] [place], [place] [address] [phone]  TOTAL KNEE REPLACEMENT POSTOPERATIVE DIRECTIONS    Knee Rehabilitation, Guidelines Following Surgery  Results after knee surgery are often greatly improved when you follow the exercise, range of motion and muscle strengthening exercises prescribed by your doctor. Safety measures are also important to protect the knee from further injury. If any of these exercises cause you to have increased pain or swelling in your knee joint, decrease the amount until you are comfortable again and slowly increase them. If you have problems or questions, call your caregiver or physical therapist for advice.   BLOOD CLOT PREVENTION Take a 325 mg Aspirin two times a day for three weeks following surgery. Then take an 81 mg Aspirin once a day for three weeks. Then discontinue Aspirin. You may resume your vitamins/supplements upon discharge from the hospital. Do not take any NSAIDs (Advil, Aleve, Ibuprofen, Meloxicam, etc.) until you have discontinued the 325 mg Aspirin.  HOME CARE INSTRUCTIONS  Remove items at home which could result in a fall. This includes throw rugs or furniture in walking pathways.  ICE to the affected knee as much as tolerated. Icing helps control swelling. If the swelling is well controlled you will be more comfortable and rehab easier. Continue to use ice on the knee for pain and swelling from surgery. You may notice swelling that will progress down to the foot and ankle. This is normal after surgery. Elevate the leg when you are not up walking on it.    Continue to use the breathing machine which will help keep your temperature down. It is common for your temperature to cycle up and down following surgery, especially at night when you are not up moving around and exerting yourself. The breathing machine keeps your lungs expanded and your temperature  down. Do not place pillow under the operative knee, focus on keeping the knee straight while resting  DIET You may resume your previous home diet once you are discharged from the hospital.  DRESSING / WOUND CARE / SHOWERING Keep your bulky bandage on for 2 days. On the third post-operative day you may remove the Ace bandage and gauze. There is a waterproof adhesive bandage on your skin which will stay in place until your first follow-up appointment. Once you remove this you will not need to place another bandage You may begin showering 3 days following surgery, but do not submerge the incision under water.  ACTIVITY For the first 5 days, the key is rest and control of pain and swelling Do your home exercises twice a day starting on post-operative day 3. On the days you go to physical therapy, just do the home exercises once that day. You should rest, ice and elevate the leg for 50 minutes out of every hour. Get up and walk/stretch for 10 minutes per hour. After 5 days you can increase your activity slowly as tolerated. Walk with your walker as instructed. Use the walker until you are comfortable transitioning to a cane. Walk with the cane in the opposite hand of the operative leg. You may discontinue the cane once you are comfortable and walking steadily. Avoid periods of inactivity such as sitting longer than an hour when not asleep. This helps prevent blood clots.  You may discontinue the knee immobilizer once you are able to perform a straight leg raise while lying down. You may resume a sexual relationship in one month   or when given the OK by your doctor.  You may return to work once you are cleared by your doctor.  Do not drive a car for 6 weeks or until released by your surgeon.  Do not drive while taking narcotics.  TED HOSE STOCKINGS Wear the elastic stockings on both legs for three weeks following surgery during the day. You may remove them at night for sleeping.  WEIGHT  BEARING Weight bearing as tolerated with assist device (walker, cane, etc) as directed, use it as long as suggested by your surgeon or therapist, typically at least 4-6 weeks.  POSTOPERATIVE CONSTIPATION PROTOCOL Constipation - defined medically as fewer than three stools per week and severe constipation as less than one stool per week.  One of the most common issues patients have following surgery is constipation.  Even if you have a regular bowel pattern at home, your normal regimen is likely to be disrupted due to multiple reasons following surgery.  Combination of anesthesia, postoperative narcotics, change in appetite and fluid intake all can affect your bowels.  In order to avoid complications following surgery, here are some recommendations in order to help you during your recovery period.  Colace (docusate) - Pick up an over-the-counter form of Colace or another stool softener and take twice a day as long as you are requiring postoperative pain medications.  Take with a full glass of water daily.  If you experience loose stools or diarrhea, hold the colace until you stool forms back up. If your symptoms do not get better within 1 week or if they get worse, check with your doctor. Dulcolax (bisacodyl) - Pick up over-the-counter and take as directed by the product packaging as needed to assist with the movement of your bowels.  Take with a full glass of water.  Use this product as needed if not relieved by Colace only.  MiraLax (polyethylene glycol) - Pick up over-the-counter to have on hand. MiraLax is a solution that will increase the amount of water in your bowels to assist with bowel movements.  Take as directed and can mix with a glass of water, juice, soda, coffee, or tea. Take if you go more than two days without a movement. Do not use MiraLax more than once per day. Call your doctor if you are still constipated or irregular after using this medication for 7 days in a row.  If you continue  to have problems with postoperative constipation, please contact the office for further assistance and recommendations.  If you experience "the worst abdominal pain ever" or develop nausea or vomiting, please contact the office immediatly for further recommendations for treatment.  ITCHING If you experience itching with your medications, try taking only a single pain pill, or even half a pain pill at a time.  You can also use Benadryl over the counter for itching or also to help with sleep.   MEDICATIONS See your medication summary on the "After Visit Summary" that the nursing staff will review with you prior to discharge.  You may have some home medications which will be placed on hold until you complete the course of blood thinner medication.  It is important for you to complete the blood thinner medication as prescribed by your surgeon.  Continue your approved medications as instructed at time of discharge.  PRECAUTIONS If you experience chest pain or shortness of breath - call 911 immediately for transfer to the hospital emergency department.  If you develop a fever greater that 101 F,   purulent drainage from wound, increased redness or drainage from wound, foul odor from the wound/dressing, or calf pain - CONTACT YOUR SURGEON.                                                   FOLLOW-UP APPOINTMENTS Make sure you keep all of your appointments after your operation with your surgeon and caregivers. You should call the office at the above phone number and make an appointment for approximately two weeks after the date of your surgery or on the date instructed by your surgeon outlined in the "After Visit Summary".  RANGE OF MOTION AND STRENGTHENING EXERCISES  Rehabilitation of the knee is important following a knee injury or an operation. After just a few days of immobilization, the muscles of the thigh which control the knee become weakened and shrink (atrophy). Knee exercises are designed to build up  the tone and strength of the thigh muscles and to improve knee motion. Often times heat used for twenty to thirty minutes before working out will loosen up your tissues and help with improving the range of motion but do not use heat for the first two weeks following surgery. These exercises can be done on a training (exercise) mat, on the floor, on a table or on a bed. Use what ever works the best and is most comfortable for you Knee exercises include:  Leg Lifts - While your knee is still immobilized in a splint or cast, you can do straight leg raises. Lift the leg to 60 degrees, hold for 3 sec, and slowly lower the leg. Repeat 10-20 times 2-3 times daily. Perform this exercise against resistance later as your knee gets better.  Quad and Hamstring Sets - Tighten up the muscle on the front of the thigh (Quad) and hold for 5-10 sec. Repeat this 10-20 times hourly. Hamstring sets are done by pushing the foot backward against an object and holding for 5-10 sec. Repeat as with quad sets.  Leg Slides: Lying on your back, slowly slide your foot toward your buttocks, bending your knee up off the floor (only go as far as is comfortable). Then slowly slide your foot back down until your leg is flat on the floor again. Angel Wings: Lying on your back spread your legs to the side as far apart as you can without causing discomfort.  A rehabilitation program following serious knee injuries can speed recovery and prevent re-injury in the future due to weakened muscles. Contact your doctor or a physical therapist for more information on knee rehabilitation.   POST-OPERATIVE OPIOID TAPER INSTRUCTIONS: It is important to wean off of your opioid medication as soon as possible. If you do not need pain medication after your surgery it is ok to stop day one. Opioids include: Codeine, Hydrocodone(Norco, Vicodin), Oxycodone(Percocet, oxycontin) and hydromorphone amongst others.  Long term and even short term use of opiods can  cause: Increased pain response Dependence Constipation Depression Respiratory depression And more.  Withdrawal symptoms can include Flu like symptoms Nausea, vomiting And more Techniques to manage these symptoms Hydrate well Eat regular healthy meals Stay active Use relaxation techniques(deep breathing, meditating, yoga) Do Not substitute Alcohol to help with tapering If you have been on opioids for less than two weeks and do not have pain than it is ok to stop all together.  Plan   to wean off of opioids This plan should start within one week post op of your joint replacement. Maintain the same interval or time between taking each dose and first decrease the dose.  Cut the total daily intake of opioids by one tablet each day Next start to increase the time between doses. The last dose that should be eliminated is the evening dose.   IF YOU ARE TRANSFERRED TO A SKILLED REHAB FACILITY If the patient is transferred to a skilled rehab facility following release from the hospital, a list of the current medications will be sent to the facility for the patient to continue.  When discharged from the skilled rehab facility, please have the facility set up the patient's Home Health Physical Therapy prior to being released. Also, the skilled facility will be responsible for providing the patient with their medications at time of release from the facility to include their pain medication, the muscle relaxants, and their blood thinner medication. If the patient is still at the rehab facility at time of the two week follow up appointment, the skilled rehab facility will also need to assist the patient in arranging follow up appointment in our office and any transportation needs.  MAKE SURE YOU:  Understand these instructions.  Get help right away if you are not doing well or get worse.   DENTAL ANTIBIOTICS:  In most cases prophylactic antibiotics for Dental procdeures after total joint surgery are  not necessary.  Exceptions are as follows:  1. History of prior total joint infection  2. Severely immunocompromised (Organ Transplant, cancer chemotherapy, Rheumatoid biologic meds such as Humera)  3. Poorly controlled diabetes (A1C &gt; 8.0, blood glucose over 200)  If you have one of these conditions, contact your surgeon for an antibiotic prescription, prior to your dental procedure.    Pick up stool softner and laxative for home use following surgery while on pain medications. Do not submerge incision under water. Please use good hand washing techniques while changing dressing each day. May shower starting three days after surgery. Please use a clean towel to pat the incision dry following showers. Continue to use ice for pain and swelling after surgery. Do not use any lotions or creams on the incision until instructed by your surgeon.  

## 2021-05-17 NOTE — Op Note (Signed)
OPERATIVE REPORT-TOTAL KNEE ARTHROPLASTY   Pre-operative diagnosis- Osteoarthritis  Right knee(s)  Post-operative diagnosis- Osteoarthritis Right knee(s)  Procedure-  Right  Total Knee Arthroplasty  Surgeon- Gus Rankin. Niralya Ohanian, MD  Assistant- Arther Abbott, PA-C   Anesthesia-   Adductor canal block and spinal  EBL-25 ml   Drains None  Tourniquet time-  Total Tourniquet Time Documented: Thigh (Right) - 40 minutes Total: Thigh (Right) - 40 minutes     Complications- None  Condition-PACU - hemodynamically stable.   Brief Clinical Note  [name] is a 70 y.o. year old female with end stage OA of her right knee with progressively worsening pain and dysfunction. She has constant pain, with activity and at rest and significant functional deficits with difficulties even with ADLs. She has had extensive non-op management including analgesics, injections of cortisone and viscosupplements, and home exercise program, but remains in significant pain with significant dysfunction.Radiographs show bone on bone arthritis medial and patellofemoral. She presents now for right Total Knee Arthroplasty.     Procedure in detail---   The patient is brought into the operating room and positioned supine on the operating table. After successful administration of  Adductor canal block and spinal,   a tourniquet is placed high on the  Right thigh(s) and the lower extremity is prepped and draped in the usual sterile fashion. Time out is performed by the operating team and then the  Right lower extremity is wrapped in Esmarch, knee flexed and the tourniquet inflated to 300 mmHg.       A midline incision is made with a ten blade through the subcutaneous tissue to the level of the extensor mechanism. A fresh blade is used to make a medial parapatellar arthrotomy. Soft tissue over the proximal medial tibia is subperiosteally elevated to the joint line with a knife and into the semimembranosus bursa with a  Cobb elevator. Soft tissue over the proximal lateral tibia is elevated with attention being paid to avoiding the patellar tendon on the tibial tubercle. The patella is everted, knee flexed 90 degrees and the ACL and PCL are removed. Findings are bone on bone medial and patellofemoral with large global osteophytes.        The drill is used to create a starting hole in the distal femur and the canal is thoroughly irrigated with sterile saline to remove the fatty contents. The 5 degree Right  valgus alignment guide is placed into the femoral canal and the distal femoral cutting block is pinned to remove 9 mm off the distal femur. Resection is made with an oscillating saw.      The tibia is subluxed forward and the menisci are removed. The extramedullary alignment guide is placed referencing proximally at the medial aspect of the tibial tubercle and distally along the second metatarsal axis and tibial crest. The block is pinned to remove 60mm off the more deficient medial  side. Resection is made with an oscillating saw. Size 6is the most appropriate size for the tibia and the proximal tibia is prepared with the modular drill and keel punch for that size.      The femoral sizing guide is placed and size 7 is most appropriate. Rotation is marked off the epicondylar axis and confirmed by creating a rectangular flexion gap at 90 degrees. The size 7 cutting block is pinned in this rotation and the anterior, posterior and chamfer cuts are made with the oscillating saw. The intercondylar block is then placed and that cut is made.  Trial size 6 tibial component, trial size 7 posterior stabilized femur and a 8  mm posterior stabilized rotating platform insert trial is placed. Full extension is achieved with excellent varus/valgus and anterior/posterior balance throughout full range of motion. The patella is everted and thickness measured to be 24  mm. Free hand resection is taken to 14 mm, a 38 template is placed, lug  holes are drilled, trial patella is placed, and it tracks normally. Osteophytes are removed off the posterior femur with the trial in place. All trials are removed and the cut bone surfaces prepared with pulsatile lavage. Cement is mixed and once ready for implantation, the size 6 tibial implant, size  7 posterior stabilized femoral component, and the size 38 patella are cemented in place and the patella is held with the clamp. The trial insert is placed and the knee held in full extension. The Exparel (20 ml mixed with 60 ml saline) is injected into the extensor mechanism, posterior capsule, medial and lateral gutters and subcutaneous tissues.  All extruded cement is removed and once the cement is hard the permanent 8 mm posterior stabilized rotating platform insert is placed into the tibial tray.      The wound is copiously irrigated with saline solution and the extensor mechanism closed with # 0 Stratofix suture. The tourniquet is released for a total tourniquet time of 40  minutes. Flexion against gravity is 140 degrees and the patella tracks normally. Subcutaneous tissue is closed with 2.0 vicryl and subcuticular with running 4.0 Monocryl. The incision is cleaned and dried and steri-strips and a bulky sterile dressing are applied. The limb is placed into a knee immobilizer and the patient is awakened and transported to recovery in stable condition.      Please note that a surgical assistant was a medical necessity for this procedure in order to perform it in a safe and expeditious manner. Surgical assistant was necessary to retract the ligaments and vital neurovascular structures to prevent injury to them and also necessary for proper positioning of the limb to allow for anatomic placement of the prosthesis.   Gus Rankin Deanthony Maull, MD    05/17/2021, 11:43 AM

## 2021-05-17 NOTE — Evaluation (Signed)
Physical Therapy Evaluation Patient Details Name: [name] MRN: [id number] DOB: [birth year] Today's Date: 05/17/2021  History of Present Illness  70yo female s/p R TKA. PMH: L TKA 2012, L foot surgery  Clinical Impression  Pt is s/p TKA resulting in the deficits listed below (see PT Problem List).  Pt amb ~ 20' with RW and min assist. Limited by pain and fatigue but motivated to work with PT.   Pt will benefit from skilled PT to increase their independence and safety with mobility to allow discharge to the venue listed below.         Recommendations for follow up therapy are one component of a multi-disciplinary discharge planning process, led by the attending physician.  Recommendations may be updated based on patient status, additional functional criteria and insurance authorization.  Follow Up Recommendations Follow physician's recommendations for discharge plan and follow up therapies    Assistance Recommended at Discharge Intermittent Supervision/Assistance  Functional Status Assessment    Equipment Recommendations  Rolling walker (2 wheels)    Recommendations for Other Services       Precautions / Restrictions Precautions Precautions: Knee Required Braces or Orthoses: Knee Immobilizer - Right Restrictions Weight Bearing Restrictions: No LLE Weight Bearing: Weight bearing as tolerated      Mobility  Bed Mobility Overal bed mobility: Needs Assistance Bed Mobility: Supine to Sit     Supine to sit: Min assist     General bed mobility comments: assist with RLE    Transfers Overall transfer level: Needs assistance Equipment used: Rolling walker (2 wheels) Transfers: Sit to/from Stand Sit to Stand: Min assist           General transfer comment: cues for hand placement and RLE position    Ambulation/Gait Ambulation/Gait assistance: Min assist Gait Distance (Feet): 20 Feet Assistive device: Rolling walker (2 wheels) Gait Pattern/deviations: Step-to  pattern;Decreased stance time - right       General Gait Details: cues for sequence, step length and RW position  Stairs            Wheelchair Mobility    Modified Rankin (Stroke Patients Only)       Balance Overall balance assessment: Needs assistance         Standing balance support: During functional activity;Reliant on assistive device for balance Standing balance-Leahy Scale: Poor                               Pertinent Vitals/Pain Pain Assessment: 0-10 Pain Score: 5  Pain Location: right knee Pain Descriptors / Indicators: Aching;Sore Pain Intervention(s): Premedicated before session;Repositioned;Monitored during session;Limited activity within patient's tolerance;Ice applied    Home Living Family/patient expects to be discharged to:: Private residence Living Arrangements: Spouse/significant other   Type of Home: House Home Access: Stairs to enter   Secretary/administrator of Steps: 3   Home Layout: One level Home Equipment: BSC/3in1      Prior Function Prior Level of Function : Independent/Modified Independent                     Hand Dominance        Extremity/Trunk Assessment   Upper Extremity Assessment Upper Extremity Assessment: Overall WFL for tasks assessed    Lower Extremity Assessment Lower Extremity Assessment: RLE deficits/detail RLE Deficits / Details: ankle WFL. knee extension and hip flexion 2+/5       Communication   Communication: No difficulties  Cognition  Arousal/Alertness: Awake/alert Behavior During Therapy: WFL for tasks assessed/performed Overall Cognitive Status: Within Functional Limits for tasks assessed                                          General Comments      Exercises Total Joint Exercises Ankle Circles/Pumps: AROM;5 reps;Both Quad Sets: AROM;Both;5 reps   Assessment/Plan    PT Assessment Patient needs continued PT services  PT Problem List Decreased  strength;Decreased range of motion;Decreased activity tolerance;Decreased balance;Decreased mobility;Pain       PT Treatment Interventions DME instruction;Therapeutic activities;Gait training;Therapeutic exercise;Functional mobility training;Patient/family education    PT Goals (Current goals can be found in the Care Plan section)  Acute Rehab PT Goals Patient Stated Goal: back to independence PT Goal Formulation: With patient Time For Goal Achievement: 05/24/21 Potential to Achieve Goals: Good    Frequency 7X/week   Barriers to discharge        Co-evaluation               AM-PAC PT "6 Clicks" Mobility  Outcome Measure Help needed turning from your back to your side while in a flat bed without using bedrails?: A Little Help needed moving from lying on your back to sitting on the side of a flat bed without using bedrails?: A Little Help needed moving to and from a bed to a chair (including a wheelchair)?: A Little Help needed standing up from a chair using your arms (e.g., wheelchair or bedside chair)?: A Little Help needed to walk in hospital room?: A Little Help needed climbing 3-5 steps with a railing? : A Lot 6 Click Score: 17    End of Session Equipment Utilized During Treatment: Gait belt;Right knee immobilizer Activity Tolerance: Patient limited by pain;Patient limited by fatigue Patient left: in chair;with call bell/phone within reach;with chair alarm set Nurse Communication: Mobility status PT Visit Diagnosis: Other abnormalities of gait and mobility (R26.89);Difficulty in walking, not elsewhere classified (R26.2)    Time: 4235-3614 PT Time Calculation (min) (ACUTE ONLY): 31 min   Charges:   PT Evaluation $PT Eval Low Complexity: 1 Low PT Treatments $Gait Training: 8-22 mins        Delice Bison, PT  Acute Rehab Dept ([facility]) [phone] Pager [phone]  05/17/2021   [facility] 05/17/2021, 4:24 PM

## 2021-05-17 NOTE — Anesthesia Postprocedure Evaluation (Signed)
Anesthesia Post Note  Patient: [name]  Procedure(s) Performed: TOTAL KNEE ARTHROPLASTY (Right: Knee)     Patient location during evaluation: PACU Anesthesia Type: Spinal Level of consciousness: oriented and awake and alert Pain management: pain level controlled Vital Signs Assessment: post-procedure vital signs reviewed and stable Respiratory status: spontaneous breathing, respiratory function stable and patient connected to nasal cannula oxygen Cardiovascular status: blood pressure returned to baseline and stable Postop Assessment: no headache, no backache and no apparent nausea or vomiting Anesthetic complications: no   No notable events documented.  Last Vitals:  Vitals:   05/17/21 1225 05/17/21 1230  BP:  135/85  Pulse:  (!) 51  Resp:  12  Temp:  (!) 36.1 C  SpO2: 99% 98%    Last Pain:  Vitals:   05/17/21 1230  TempSrc:   PainSc: 0-No pain                 Simona Rocque S

## 2021-05-17 NOTE — Interval H&P Note (Signed)
History and Physical Interval Note:  05/17/2021 8:33 AM  [name]  has presented today for surgery, with the diagnosis of right knee osteoarthritis.  The various methods of treatment have been discussed with the patient and family. After consideration of risks, benefits and other options for treatment, the patient has consented to  Procedure(s): TOTAL KNEE ARTHROPLASTY (Right) as a surgical intervention.  The patient's history has been reviewed, patient examined, no change in status, stable for surgery.  I have reviewed the patient's chart and labs.  Questions were answered to the patient's satisfaction.     Homero Fellers Amyia Lodwick

## 2021-05-17 NOTE — Transfer of Care (Signed)
Immediate Anesthesia Transfer of Care Note  Patient: [name]  Procedure(s) Performed: Procedure(s): TOTAL KNEE ARTHROPLASTY (Right)  Patient Location: PACU  Anesthesia Type:Spinal  Level of Consciousness: awake, alert  and oriented  Airway & Oxygen Therapy: Patient Spontanous Breathing  Post-op Assessment: Report given to RN and Post -op Vital signs reviewed and stable  Post vital signs: Reviewed and stable  Last Vitals:  Vitals:   05/17/21 0953 05/17/21 0958  BP: 138/72 135/73  Pulse: (!) 57 (!) 56  Resp: 10 10  Temp:    SpO2: 100% 100%    Complications: No apparent anesthesia complications

## 2021-05-17 NOTE — Anesthesia Procedure Notes (Signed)
Anesthesia Regional Block: Adductor canal block   Pre-Anesthetic Checklist: , timeout performed,  Correct Patient, Correct Site, Correct Laterality,  Correct Procedure, Correct Position, site marked,  Risks and benefits discussed,  Surgical consent,  Pre-op evaluation,  At surgeon's request and post-op pain management  Laterality: Right  Prep: chloraprep       Needles:  Injection technique: Single-shot  Needle Type: Echogenic Needle     Needle Length: 9cm      Additional Needles:   Procedures:,,,, ultrasound used (permanent image in chart),,    Narrative:  Start time: 05/17/2021 9:36 AM End time: 05/17/2021 9:42 AM Injection made incrementally with aspirations every 5 mL.  Performed by: Personally  Anesthesiologist: Eilene Ghazi, MD  Additional Notes: Patient tolerated the procedure well without complications

## 2021-05-17 NOTE — Plan of Care (Signed)
  Problem: Pain Managment: Goal: General experience of comfort will improve Outcome: Progressing   Problem: Safety: Goal: Ability to remain free from injury will improve Outcome: Progressing   Problem: Activity: Goal: Ability to avoid complications of mobility impairment will improve Outcome: Progressing

## 2021-05-18 ENCOUNTER — Encounter (HOSPITAL_COMMUNITY): Payer: Self-pay | Admitting: Orthopedic Surgery

## 2021-05-18 DIAGNOSIS — M1711 Unilateral primary osteoarthritis, right knee: Secondary | ICD-10-CM | POA: Diagnosis not present

## 2021-05-18 LAB — BASIC METABOLIC PANEL
Anion gap: 6 (ref 5–15)
BUN: 14 mg/dL (ref 8–23)
CO2: 27 mmol/L (ref 22–32)
Calcium: 8.7 mg/dL — ABNORMAL LOW (ref 8.9–10.3)
Chloride: 103 mmol/L (ref 98–111)
Creatinine, Ser: 0.84 mg/dL (ref 0.44–1.00)
GFR, Estimated: >60 mL/min (ref >60)
Glucose, Bld: 119 mg/dL — ABNORMAL HIGH (ref 70–99)
Potassium: 4.1 mmol/L (ref 3.5–5.1)
Sodium: 136 mmol/L (ref 135–145)

## 2021-05-18 LAB — CBC
HCT: 32.8 % — ABNORMAL LOW (ref 36.0–46.0)
Hemoglobin: 10.8 g/dL — ABNORMAL LOW (ref 12.0–15.0)
MCH: 30.3 pg (ref 26.0–34.0)
MCHC: 32.9 g/dL (ref 30.0–36.0)
MCV: 92.1 fL (ref 80.0–100.0)
Platelets: 178 10*3/uL (ref 150–400)
RBC: 3.56 MIL/uL — ABNORMAL LOW (ref 3.87–5.11)
RDW: 12.4 % (ref 11.5–15.5)
WBC: 7.2 10*3/uL (ref 4.0–10.5)
nRBC: 0 % (ref 0.0–0.2)

## 2021-05-18 MED ORDER — TRAMADOL HCL 50 MG PO TABS
50.0000 mg | ORAL_TABLET | Freq: Four times a day (QID) | ORAL | 0 refills | Status: DC | PRN
Start: 2021-05-18 — End: 2023-08-18

## 2021-05-18 MED ORDER — METHOCARBAMOL 500 MG PO TABS
500.0000 mg | ORAL_TABLET | Freq: Four times a day (QID) | ORAL | 0 refills | Status: DC | PRN
Start: 2021-05-18 — End: 2023-08-18

## 2021-05-18 MED ORDER — OXYCODONE HCL 5 MG PO TABS: 5.0000 mg | ORAL_TABLET | Freq: Four times a day (QID) | ORAL | 0 refills | Status: DC | PRN

## 2021-05-18 MED ORDER — ASPIRIN 325 MG PO TBEC: 325.0000 mg | DELAYED_RELEASE_TABLET | Freq: Two times a day (BID) | ORAL | 0 refills | Status: AC

## 2021-05-18 MED ORDER — GABAPENTIN 300 MG PO CAPS: ORAL_CAPSULE | ORAL | 0 refills | Status: DC

## 2021-05-18 NOTE — Progress Notes (Signed)
Orthopedic Tech Progress Note Patient Details:  [name] Feb 25, 1951 [id number]  Patient ID: [name], female   DOB: 09/12/1950, 70 y.o.   MRN: [id number] Pt requested I come back at 7:30pm, so they could finish dinner before going in the CPM.  [name] 05/18/2021, 6:48 PM

## 2021-05-18 NOTE — TOC Transition Note (Signed)
Transition of Care [facility]) - CM/SW Discharge Note  Patient Details  Name: BRAYDEN BETTERS MRN: [id number] Date of Birth: 04/12/1951  Transition of Care [facility]) CM/SW Contact:  Sherie Don, LCSW Phone Number: 05/18/2021, 11:07 AM  Clinical Narrative: Patient is expected to discharge home after working with PT. CSW met with patient to confirm discharge plan and needs. Patient will discharge home with OPPT at [facility] in [place]. Patient has a 3N1, but will need a rolling walker. [employer] delivered rolling walker to patient's room. TOC signing off.  Final next level of care: OP Rehab Barriers to Discharge: No Barriers Identified  Patient Goals and CMS Choice Patient states their goals for this hospitalization and ongoing recovery are:: Discharge home with [place] CMS Medicare.gov Compare Post Acute Care list provided to:: Patient Choice offered to / list presented to : Patient  Discharge Plan and Services       DME Arranged: Walker rolling DME Agency: [employer] Representative spoke with at DME Agency: Prearranged in orthopedist's office HH Arranged: NA [place] Agency: NA  Readmission Risk Interventions No flowsheet data found.

## 2021-05-18 NOTE — Progress Notes (Signed)
Orthopedic Tech Progress Note Patient Details:  [name] [birth year] [id number]  Patient ID: [name], female   DOB: Aug 06, 1950, 70 y.o.   MRN: [id number] CPM removed. [name] 05/18/2021, 9:07 PM

## 2021-05-18 NOTE — Progress Notes (Signed)
Orthopedic Tech Progress Note Patient Details:  [name] 1951-04-30 [id number]  Patient ID: [name], female   DOB: 11-08-1950, 70 y.o.   MRN: [id number] Pt currently up in chair, will visit and apply CPM later.  [name] 05/18/2021, 2:34 PM

## 2021-05-18 NOTE — Progress Notes (Signed)
Physical Therapy Treatment Patient Details Name: [name] MRN: [id number] DOB: Oct 03, 1950 Today's Date: 05/18/2021   History of Present Illness 70yo female s/p R TKA. PMH: L TKA 2012, L foot surgery    PT Comments    Pt progressing toward PT goals. Improving with mobility and pain control, feel pt will benefit from another day  to meet goals/have safe d/c home  Recommendations for follow up therapy are one component of a multi-disciplinary discharge planning process, led by the attending physician.  Recommendations may be updated based on patient status, additional functional criteria and insurance authorization.  Follow Up Recommendations  Follow physician's recommendations for discharge plan and follow up therapies     Assistance Recommended at Discharge Intermittent Supervision/Assistance  Equipment Recommendations  Rolling walker (2 wheels)    Recommendations for Other Services       Precautions / Restrictions Precautions Precautions: Knee Restrictions Weight Bearing Restrictions: No LLE Weight Bearing: Weight bearing as tolerated     Mobility  Bed Mobility Overal bed mobility: Needs Assistance Bed Mobility: Sit to Supine       Sit to supine: Min assist   General bed mobility comments: assist with RLE    Transfers Overall transfer level: Needs assistance Equipment used: Rolling walker (2 wheels) Transfers: Sit to/from Stand Sit to Stand: Min assist;Min guard           General transfer comment: cues for hand placement and RLE position. min/guard for safety to rise and transition to RW    Ambulation/Gait Ambulation/Gait assistance: Min assist;Min guard Gait Distance (Feet): 15 Feet (x2) Assistive device: Rolling walker (2 wheels) Gait Pattern/deviations: Step-to pattern;Decreased stance time - right       General Gait Details: cues for sequence, step length and RW position. improved gait stability with RW support   Stairs              Wheelchair Mobility    Modified Rankin (Stroke Patients Only)       Balance Overall balance assessment: Needs assistance         Standing balance support: During functional activity;Reliant on assistive device for balance Standing balance-Leahy Scale: Poor                              Cognition Arousal/Alertness: Awake/alert Behavior During Therapy: WFL for tasks assessed/performed Overall Cognitive Status: Within Functional Limits for tasks assessed                                          Exercises Total Joint Exercises Ankle Circles/Pumps: AROM;Both;10 reps Heel Slides: AROM;Right;AAROM;10 reps Hip ABduction/ADduction: AROM;Right;10 reps Straight Leg Raises: AROM;Right;10 reps;AAROM    General Comments        Pertinent Vitals/Pain Pain Assessment: 0-10 Pain Score: 4  Pain Location: right knee Pain Descriptors / Indicators: Aching;Sore;Burning Pain Intervention(s): Limited activity within patient's tolerance;Monitored during session;Premedicated before session;Repositioned    Home Living                          Prior Function            PT Goals (current goals can now be found in the care plan section) Acute Rehab PT Goals Patient Stated Goal: back to independence PT Goal Formulation: With patient Time For Goal Achievement: 05/24/21 Potential to  Achieve Goals: Good Progress towards PT goals: Progressing toward goals    Frequency    7X/week      PT Plan Current plan remains appropriate    Co-evaluation              AM-PAC PT "6 Clicks" Mobility   Outcome Measure  Help needed turning from your back to your side while in a flat bed without using bedrails?: A Little Help needed moving from lying on your back to sitting on the side of a flat bed without using bedrails?: A Little Help needed moving to and from a bed to a chair (including a wheelchair)?: A Little Help needed standing up from a  chair using your arms (e.g., wheelchair or bedside chair)?: A Little Help needed to walk in hospital room?: A Little Help needed climbing 3-5 steps with a railing? : A Lot 6 Click Score: 17    End of Session Equipment Utilized During Treatment: Gait belt;Right knee immobilizer Activity Tolerance: Patient tolerated treatment well Patient left: with call bell/phone within reach;in bed;with bed alarm set Nurse Communication: Mobility status PT Visit Diagnosis: Other abnormalities of gait and mobility (R26.89);Difficulty in walking, not elsewhere classified (R26.2)     Time: 3235-5732 PT Time Calculation (min) (ACUTE ONLY): 28 min  Charges:  $Gait Training: 8-22 mins $Therapeutic Exercise: 8-22 mins                     Delice Bison, PT  Acute Rehab Dept ([facility]) [phone] Pager [phone]  05/18/2021    [facility] 05/18/2021, 5:07 PM

## 2021-05-18 NOTE — Progress Notes (Signed)
   Subjective: 1 Day Post-Op Procedure(s) (LRB): TOTAL KNEE ARTHROPLASTY (Right) Patient reports pain as mild.   Patient seen in rounds by Dr. Lequita Halt. Patient is well, and has had no acute complaints or problems. No issues overnight. Denies chest pain or SOB. Foley catheter removed this AM. We will continue therapy today.   Objective: Vital signs in last 24 hours: Temp:  [96.7 F (35.9 C)-98.6 F (37 C)] 98.5 F (36.9 C) (11/15 0512) Pulse Rate:  [51-84] 67 (11/15 0512) Resp:  [10-19] 16 (11/15 0512) BP: (113-177)/(68-96) 163/76 (11/15 0512) SpO2:  [95 %-100 %] 95 % (11/15 0512) Weight:  [90.3 kg] 90.3 kg (11/14 0812)  Intake/Output from previous day:  Intake/Output Summary (Last 24 hours) at 05/18/2021 0808 Last data filed at 05/18/2021 0600 Gross per 24 hour  Intake 2995.3 ml  Output 2410 ml  Net 585.3 ml     Intake/Output this shift: No intake/output data recorded.  Labs: Recent Labs    05/18/21 0323  HGB 10.8*   Recent Labs    05/18/21 0323  WBC 7.2  RBC 3.56*  HCT 32.8*  PLT 178   Recent Labs    05/18/21 0323  NA 136  K 4.1  CL 103  CO2 27  BUN 14  CREATININE 0.84  GLUCOSE 119*  CALCIUM 8.7*   No results for input(s): LABPT, INR in the last 72 hours.  Exam: General - Patient is Alert and Oriented Extremity - Neurologically intact Neurovascular intact Sensation intact distally Dorsiflexion/Plantar flexion intact Dressing - dressing C/D/I Motor Function - intact, moving foot and toes well on exam.   History reviewed. No pertinent past medical history.  Assessment/Plan: 1 Day Post-Op Procedure(s) (LRB): TOTAL KNEE ARTHROPLASTY (Right) Principal Problem:   OA (osteoarthritis) of knee Active Problems:   Primary osteoarthritis of right knee  Estimated body mass index is 35.27 kg/m as calculated from the following:   Height as of this encounter: 5\' 3"  (1.6 m).   Weight as of this encounter: 90.3 kg. Advance diet Up with therapy D/C  IV fluids   Patient's anticipated LOS is less than 2 midnights, meeting these requirements:  - Lives within 1 hour of care - Has a competent adult at home to recover with post-op recover - NO history of  - Chronic pain requiring opioids  - Diabetes  - Coronary Artery Disease  - Heart failure  - Heart attack  - Stroke  - DVT/VTE  - Cardiac arrhythmia  - Respiratory Failure/COPD  - Renal failure  - Anemia  - Advanced Liver disease  DVT Prophylaxis - Aspirin Weight bearing as tolerated. Continue therapy.  Plan is to go Home after hospital stay. Plan for discharge later today once meeting goals with therapy. Scheduled for OPPT at [facility]) Follow-up in the office in 2 weeks  The PDMP database was reviewed today prior to any opioid medications being prescribed to this patient.   [facility], PA-C Orthopedic Surgery [phone] 05/18/2021, 8:08 AM

## 2021-05-18 NOTE — Progress Notes (Signed)
Physical Therapy Treatment Patient Details Name: [name] MRN: [id number] DOB: October 10, 1950 Today's Date: 05/18/2021   History of Present Illness 70yo female s/p R TKA. PMH: L TKA 2012, L foot surgery    PT Comments    Pt states her pain is controlled for the first time since surgery. She is progressing, however is unsteady with gait, LOB x 3 requiring min assist to recover. Gait stability improved with distance. Pt does not have 24 hour assist at home. May need another day of PT for safe d/c home, will see how she does this afternoon.   Recommendations for follow up therapy are one component of a multi-disciplinary discharge planning process, led by the attending physician.  Recommendations may be updated based on patient status, additional functional criteria and insurance authorization.  Follow Up Recommendations  Follow physician's recommendations for discharge plan and follow up therapies     Assistance Recommended at Discharge Intermittent Supervision/Assistance  Equipment Recommendations  Rolling walker (2 wheels)    Recommendations for Other Services       Precautions / Restrictions Precautions Precautions: Knee Restrictions Weight Bearing Restrictions: No LLE Weight Bearing: Weight bearing as tolerated     Mobility  Bed Mobility Overal bed mobility: Needs Assistance Bed Mobility: Supine to Sit     Supine to sit: Min guard     General bed mobility comments: for safety, able to use gait belt as leg lifter, incr time    Transfers Overall transfer level: Needs assistance Equipment used: Rolling walker (2 wheels) Transfers: Sit to/from Stand Sit to Stand: Min assist           General transfer comment: cues for hand placement and RLE position; 3 attempts to come to stand    Ambulation/Gait Ambulation/Gait assistance: Min assist Gait Distance (Feet): 25 Feet (12' more) Assistive device: Rolling walker (2 wheels) Gait Pattern/deviations: Step-to  pattern;Decreased stance time - right       General Gait Details: cues for sequence, step length and RW position. pt with LOB x3 in bathroom, cues for RW safety and min assist for balance   Stairs             Wheelchair Mobility    Modified Rankin (Stroke Patients Only)       Balance Overall balance assessment: Needs assistance         Standing balance support: During functional activity;Reliant on assistive device for balance Standing balance-Leahy Scale: Poor Standing balance comment: LOB x3 in bathroom with amb/functional activities requiring min assist and safety cues to recover                            Cognition Arousal/Alertness: Awake/alert Behavior During Therapy: WFL for tasks assessed/performed Overall Cognitive Status: Within Functional Limits for tasks assessed                                          Exercises Total Joint Exercises Ankle Circles/Pumps: AROM;Both;10 reps    General Comments        Pertinent Vitals/Pain Pain Assessment: 0-10 Pain Score: 4  Pain Location: right knee Pain Descriptors / Indicators: Aching;Sore;Burning Pain Intervention(s): Limited activity within patient's tolerance;Monitored during session;Premedicated before session;Repositioned    Home Living  Prior Function            PT Goals (current goals can now be found in the care plan section) Acute Rehab PT Goals Patient Stated Goal: back to independence PT Goal Formulation: With patient Time For Goal Achievement: 05/24/21 Potential to Achieve Goals: Good Progress towards PT goals: Progressing toward goals    Frequency    7X/week      PT Plan Current plan remains appropriate    Co-evaluation              AM-PAC PT "6 Clicks" Mobility   Outcome Measure  Help needed turning from your back to your side while in a flat bed without using bedrails?: A Little Help needed moving from  lying on your back to sitting on the side of a flat bed without using bedrails?: A Little Help needed moving to and from a bed to a chair (including a wheelchair)?: A Little Help needed standing up from a chair using your arms (e.g., wheelchair or bedside chair)?: A Little Help needed to walk in hospital room?: A Little Help needed climbing 3-5 steps with a railing? : A Lot 6 Click Score: 17    End of Session Equipment Utilized During Treatment: Gait belt;Right knee immobilizer Activity Tolerance: Patient tolerated treatment well;Patient limited by fatigue Patient left: in chair;with call bell/phone within reach;with chair alarm set Nurse Communication: Mobility status PT Visit Diagnosis: Other abnormalities of gait and mobility (R26.89);Difficulty in walking, not elsewhere classified (R26.2)     Time: 1203-1224 PT Time Calculation (min) (ACUTE ONLY): 21 min  Charges:  $Gait Training: 8-22 mins                     Delice Bison, PT  Acute Rehab Dept ([facility]) [phone] Pager [phone]  05/18/2021    [facility] 05/18/2021, 12:48 PM

## 2021-05-18 NOTE — Progress Notes (Signed)
Orthopedic Tech Progress Note Patient Details:  [name] 1951/03/10 [id number]  CPM Right Knee CPM Right Knee: On Right Knee Flexion (Degrees): 40 Right Knee Extension (Degrees): 10 Additional Comments: pt unable to tolerate 50 degrees  Post Interventions Patient Tolerated: Well Instructions Provided: Adjustment of device  [place] A Gerilyn Pilgrim 05/18/2021, 7:49 PM

## 2021-05-19 DIAGNOSIS — Z96651 Presence of right artificial knee joint: Secondary | ICD-10-CM | POA: Diagnosis not present

## 2021-05-19 DIAGNOSIS — M1711 Unilateral primary osteoarthritis, right knee: Secondary | ICD-10-CM | POA: Diagnosis not present

## 2021-05-19 LAB — BASIC METABOLIC PANEL
Anion gap: 6 (ref 5–15)
BUN: 15 mg/dL (ref 8–23)
CO2: 28 mmol/L (ref 22–32)
Calcium: 8.8 mg/dL — ABNORMAL LOW (ref 8.9–10.3)
Chloride: 103 mmol/L (ref 98–111)
Creatinine, Ser: 0.79 mg/dL (ref 0.44–1.00)
GFR, Estimated: >60 mL/min (ref >60)
Glucose, Bld: 111 mg/dL — ABNORMAL HIGH (ref 70–99)
Potassium: 4.1 mmol/L (ref 3.5–5.1)
Sodium: 137 mmol/L (ref 135–145)

## 2021-05-19 LAB — CBC
HCT: 31.9 % — ABNORMAL LOW (ref 36.0–46.0)
Hemoglobin: 10.8 g/dL — ABNORMAL LOW (ref 12.0–15.0)
MCH: 30.8 pg (ref 26.0–34.0)
MCHC: 33.9 g/dL (ref 30.0–36.0)
MCV: 90.9 fL (ref 80.0–100.0)
Platelets: 179 10*3/uL (ref 150–400)
RBC: 3.51 MIL/uL — ABNORMAL LOW (ref 3.87–5.11)
RDW: 12.4 % (ref 11.5–15.5)
WBC: 9.8 10*3/uL (ref 4.0–10.5)
nRBC: 0 % (ref 0.0–0.2)

## 2021-05-19 NOTE — Progress Notes (Signed)
Physical Therapy Treatment Patient Details Name: [name] MRN: [id number] DOB: 1951/01/05 Today's Date: 05/19/2021   History of Present Illness 70yo female s/p R TKA. PMH: L TKA 2012, L foot surgery    PT Comments    Pt progressing well this pm, meeting goals. Ready to d/c with family assist as needed   Recommendations for follow up therapy are one component of a multi-disciplinary discharge planning process, led by the attending physician.  Recommendations may be updated based on patient status, additional functional criteria and insurance authorization.  Follow Up Recommendations  Follow physician's recommendations for discharge plan and follow up therapies     Assistance Recommended at Discharge Intermittent Supervision/Assistance  Equipment Recommendations  Rolling walker (2 wheels)    Recommendations for Other Services       Precautions / Restrictions Precautions Precautions: Knee Required Braces or Orthoses: Knee Immobilizer - Right Restrictions LLE Weight Bearing: Weight bearing as tolerated     Mobility  Bed Mobility Overal bed mobility: Needs Assistance Bed Mobility: Sit to Supine     Supine to sit: Supervision Sit to supine: Min guard   General bed mobility comments: assist with RLE    Transfers Overall transfer level: Needs assistance Equipment used: Rolling walker (2 wheels) Transfers: Sit to/from Stand Sit to Stand: Supervision           General transfer comment: cues for hand placement and RLE position. min/guard to supervision for safety to rise and transition to RW    Ambulation/Gait Ambulation/Gait assistance: Supervision Gait Distance (Feet): 70 Feet Assistive device: Rolling walker (2 wheels) Gait Pattern/deviations: Step-to pattern;Decreased stance time - right       General Gait Details: cues for sequence, step length and RW position. improved gait stability with RW support   Stairs Stairs: Yes Stairs assistance: Min  guard Stair Management: One rail Right;One rail Left;Step to pattern;Forwards Number of Stairs: 3 General stair comments: cues for sequence and safety   Wheelchair Mobility    Modified Rankin (Stroke Patients Only)       Balance Overall balance assessment: Needs assistance         Standing balance support: During functional activity;Reliant on assistive device for balance (able to stand, wash hands without UE support) Standing balance-Leahy Scale: Fair                              Cognition Arousal/Alertness: Awake/alert Behavior During Therapy: WFL for tasks assessed/performed Overall Cognitive Status: Within Functional Limits for tasks assessed                                          Exercises Total Joint Exercises Ankle Circles/Pumps: AROM;Both;10 reps Quad Sets: AROM;Both;10 reps Heel Slides: AROM;Right;AAROM;10 reps Straight Leg Raises: AROM;Right;10 reps;AAROM    General Comments        Pertinent Vitals/Pain Pain Assessment: 0-10 Pain Score: 4  Pain Location: right knee Pain Descriptors / Indicators: Aching;Sore;Burning Pain Intervention(s): Limited activity within patient's tolerance;Monitored during session;Premedicated before session;Repositioned    Home Living                          Prior Function            PT Goals (current goals can now be found in the care plan section) Acute Rehab PT  Goals Patient Stated Goal: back to independence PT Goal Formulation: With patient Time For Goal Achievement: 05/24/21 Potential to Achieve Goals: Good Progress towards PT goals: Progressing toward goals    Frequency    7X/week      PT Plan Current plan remains appropriate    Co-evaluation              AM-PAC PT "6 Clicks" Mobility   Outcome Measure  Help needed turning from your back to your side while in a flat bed without using bedrails?: A Little Help needed moving from lying on your back to  sitting on the side of a flat bed without using bedrails?: A Little Help needed moving to and from a bed to a chair (including a wheelchair)?: A Little Help needed standing up from a chair using your arms (e.g., wheelchair or bedside chair)?: A Little Help needed to walk in hospital room?: A Little Help needed climbing 3-5 steps with a railing? : A Lot 6 Click Score: 17    End of Session Equipment Utilized During Treatment: Gait belt;Right knee immobilizer Activity Tolerance: Patient tolerated treatment well Patient left: with call bell/phone within reach;in bed;with bed alarm set Nurse Communication: Mobility status PT Visit Diagnosis: Other abnormalities of gait and mobility (R26.89);Difficulty in walking, not elsewhere classified (R26.2)     Time: 6962-9528 PT Time Calculation (min) (ACUTE ONLY): 18 min  Charges:  $Gait Training: 8-22 mins                     Delice Bison, PT  Acute Rehab Dept ([facility]) [phone] Pager [phone]  05/19/2021    [facility] 05/19/2021, 2:47 PM

## 2021-05-19 NOTE — Plan of Care (Signed)
Pt ready to DC home with family. 

## 2021-05-19 NOTE — Progress Notes (Signed)
Physical Therapy Treatment Patient Details Name: [name] MRN: [id number] DOB: Nov 01, 1950 Today's Date: 05/19/2021   History of Present Illness 70yo female s/p R TKA. PMH: L TKA 2012, L foot surgery    PT Comments    Pt progressing well. Much improved activity tolerance and gait stability today. Will see again later today, pt should be ready to d/c this pm.   Recommendations for follow up therapy are one component of a multi-disciplinary discharge planning process, led by the attending physician.  Recommendations may be updated based on patient status, additional functional criteria and insurance authorization.  Follow Up Recommendations  Follow physician's recommendations for discharge plan and follow up therapies     Assistance Recommended at Discharge Intermittent Supervision/Assistance  Equipment Recommendations  Rolling walker (2 wheels)    Recommendations for Other Services       Precautions / Restrictions Precautions Precautions: Knee Required Braces or Orthoses: Knee Immobilizer - Right Restrictions LLE Weight Bearing: Weight bearing as tolerated     Mobility  Bed Mobility Overal bed mobility: Needs Assistance Bed Mobility: Sit to Supine     Supine to sit: Supervision Sit to supine: Min assist   General bed mobility comments: assist with RLE    Transfers Overall transfer level: Needs assistance Equipment used: Rolling walker (2 wheels) Transfers: Sit to/from Stand Sit to Stand: Min guard;Supervision           General transfer comment: cues for hand placement and RLE position. min/guard to supervision for safety to rise and transition to RW    Ambulation/Gait Ambulation/Gait assistance: Min Emergency planning/management officer (Feet): 65 Feet (15' more) Assistive device: Rolling walker (2 wheels) Gait Pattern/deviations: Step-to pattern;Decreased stance time - right       General Gait Details: cues for sequence, step length and RW position.  improved gait stability with RW support   Stairs Stairs: Yes Stairs assistance: Min guard Stair Management: One rail Right;One rail Left;Step to pattern;Forwards Number of Stairs: 3 General stair comments: cues for sequence and safety   Wheelchair Mobility    Modified Rankin (Stroke Patients Only)       Balance Overall balance assessment: Needs assistance         Standing balance support: During functional activity;Reliant on assistive device for balance Standing balance-Leahy Scale: Poor                              Cognition Arousal/Alertness: Awake/alert Behavior During Therapy: WFL for tasks assessed/performed Overall Cognitive Status: Within Functional Limits for tasks assessed                                          Exercises Total Joint Exercises Ankle Circles/Pumps: AROM;Both;10 reps    General Comments        Pertinent Vitals/Pain Pain Assessment: 0-10 Pain Score: 5  Pain Location: right knee Pain Descriptors / Indicators: Aching;Sore;Burning Pain Intervention(s): Limited activity within patient's tolerance;Monitored during session;Premedicated before session;Repositioned    Home Living                          Prior Function            PT Goals (current goals can now be found in the care plan section) Acute Rehab PT Goals Patient Stated Goal: back to independence PT  Goal Formulation: With patient Time For Goal Achievement: 05/24/21 Potential to Achieve Goals: Good Progress towards PT goals: Progressing toward goals    Frequency    7X/week      PT Plan Current plan remains appropriate    Co-evaluation              AM-PAC PT "6 Clicks" Mobility   Outcome Measure  Help needed turning from your back to your side while in a flat bed without using bedrails?: A Little Help needed moving from lying on your back to sitting on the side of a flat bed without using bedrails?: A Little Help  needed moving to and from a bed to a chair (including a wheelchair)?: A Little Help needed standing up from a chair using your arms (e.g., wheelchair or bedside chair)?: A Little Help needed to walk in hospital room?: A Little Help needed climbing 3-5 steps with a railing? : A Lot 6 Click Score: 17    End of Session Equipment Utilized During Treatment: Gait belt;Right knee immobilizer Activity Tolerance: Patient tolerated treatment well Patient left: with call bell/phone within reach;in bed;with bed alarm set Nurse Communication: Mobility status PT Visit Diagnosis: Other abnormalities of gait and mobility (R26.89);Difficulty in walking, not elsewhere classified (R26.2)     Time: 2671-2458 PT Time Calculation (min) (ACUTE ONLY): 27 min  Charges:  $Gait Training: 23-37 mins                     Delice Bison, PT  Acute Rehab Dept ([facility]) [phone] Pager [phone]  05/19/2021    [facility] 05/19/2021, 11:28 AM

## 2021-05-19 NOTE — Progress Notes (Signed)
   Subjective: 2 Days Post-Op Procedure(s) (LRB): TOTAL KNEE ARTHROPLASTY (Right) Patient reports pain as mild.   Patient seen in rounds FOR Dr. Lequita Halt. Patient is well, and has had no acute complaints or problems. Pain much improved from yesterday. Denies chest pain, SOB or calf pain. Voiding without difficulty. Plan is to go Home after hospital stay.  Objective: Vital signs in last 24 hours: Temp:  [97.8 F (36.6 C)-99.7 F (37.6 C)] 98.5 F (36.9 C) (11/16 0528) Pulse Rate:  [61-74] 74 (11/16 0528) Resp:  [16-18] 16 (11/16 0528) BP: (160-179)/(72-93) 179/93 (11/16 0528) SpO2:  [95 %-97 %] 96 % (11/16 0528)  Intake/Output from previous day:  Intake/Output Summary (Last 24 hours) at 05/19/2021 0918 Last data filed at 05/19/2021 0813 Gross per 24 hour  Intake 958.57 ml  Output --  Net 958.57 ml    Intake/Output this shift: Total I/O In: 100 [P.O.:100] Out: -   Labs: Recent Labs    05/18/21 0323 05/19/21 0331  HGB 10.8* 10.8*   Recent Labs    05/18/21 0323 05/19/21 0331  WBC 7.2 9.8  RBC 3.56* 3.51*  HCT 32.8* 31.9*  PLT 178 179   Recent Labs    05/18/21 0323 05/19/21 0331  NA 136 137  K 4.1 4.1  CL 103 103  CO2 27 28  BUN 14 15  CREATININE 0.84 0.79  GLUCOSE 119* 111*  CALCIUM 8.7* 8.8*   No results for input(s): LABPT, INR in the last 72 hours.  Exam: General - Patient is Alert and Oriented Extremity - Neurologically intact Neurovascular intact Sensation intact distally Dorsiflexion/Plantar flexion intact Dressing/Incision - clean, dry, no drainage. Bulky dressing removed, Aquacel intact. Motor Function - intact, moving foot and toes well on exam.   History reviewed. No pertinent past medical history.  Assessment/Plan: 2 Days Post-Op Procedure(s) (LRB): TOTAL KNEE ARTHROPLASTY (Right) Principal Problem:   OA (osteoarthritis) of knee Active Problems:   Primary osteoarthritis of right knee  Estimated body mass index is 35.27 kg/m as  calculated from the following:   Height as of this encounter: 5\' 3"  (1.6 m).   Weight as of this encounter: 90.3 kg. Up with therapy  DVT Prophylaxis - Aspirin Weight-bearing as tolerated  BP elevated this AM and yesterday. This was a known issue prior to surgery, patient's PCP is aware and following this. Not on antihypertensive medication. Patient states next appt with PCP is in 6 months. Encouraged patient to get blood pressure cuff for home and to check this, if it continues to stay this elevated (patient's pain much improved this AM compared to yesterday and BP still high), she will contact her PCP for a recheck sooner.   Up with therapy today, plan for discharge after two sessions of PT.  , PA-C Orthopedic Surgery [phone] 05/19/2021, 9:18 AM

## 2021-05-19 NOTE — Plan of Care (Signed)
  Problem: Activity: Goal: Risk for activity intolerance will decrease Outcome: Progressing   Problem: Elimination: Goal: Will not experience complications related to bowel motility Outcome: Progressing   Problem: Pain Managment: Goal: General experience of comfort will improve Outcome: Progressing   Problem: Safety: Goal: Ability to remain free from injury will improve Outcome: Progressing   

## 2021-05-20 DIAGNOSIS — R531 Weakness: Secondary | ICD-10-CM | POA: Diagnosis not present

## 2021-05-20 DIAGNOSIS — R2689 Other abnormalities of gait and mobility: Secondary | ICD-10-CM | POA: Diagnosis not present

## 2021-05-20 DIAGNOSIS — Z96651 Presence of right artificial knee joint: Secondary | ICD-10-CM | POA: Diagnosis not present

## 2021-05-20 DIAGNOSIS — M25561 Pain in right knee: Secondary | ICD-10-CM | POA: Diagnosis not present

## 2021-05-20 DIAGNOSIS — M25661 Stiffness of right knee, not elsewhere classified: Secondary | ICD-10-CM | POA: Diagnosis not present

## 2021-05-21 DIAGNOSIS — M25561 Pain in right knee: Secondary | ICD-10-CM | POA: Diagnosis not present

## 2021-05-21 DIAGNOSIS — Z96651 Presence of right artificial knee joint: Secondary | ICD-10-CM | POA: Diagnosis not present

## 2021-05-21 DIAGNOSIS — M25661 Stiffness of right knee, not elsewhere classified: Secondary | ICD-10-CM | POA: Diagnosis not present

## 2021-05-21 DIAGNOSIS — R2689 Other abnormalities of gait and mobility: Secondary | ICD-10-CM | POA: Diagnosis not present

## 2021-05-21 DIAGNOSIS — R531 Weakness: Secondary | ICD-10-CM | POA: Diagnosis not present

## 2021-06-02 NOTE — Discharge Summary (Signed)
Physician Discharge Summary   Patient ID: [name] MRN: [id number] DOB/AGE: October 24, 1950 70 y.o.  Admit date: 05/17/2021 Discharge date: 05/19/2021  Primary Diagnosis: Osteoarthritis, right knee   Admission Diagnoses:  History reviewed. No pertinent past medical history. Discharge Diagnoses:   Principal Problem:   OA (osteoarthritis) of knee Active Problems:   Primary osteoarthritis of right knee  Estimated body mass index is 35.27 kg/m as calculated from the following:   Height as of this encounter: 5\' 3"  (1.6 m).   Weight as of this encounter: 90.3 kg.  Procedure:  Procedure(s) (LRB): TOTAL KNEE ARTHROPLASTY (Right)   Consults: None  HPI: [name] is a 70 y.o. year old female with end stage OA of her right knee with progressively worsening pain and dysfunction. She has constant pain, with activity and at rest and significant functional deficits with difficulties even with ADLs. She has had extensive non-op management including analgesics, injections of cortisone and viscosupplements, and home exercise program, but remains in significant pain with significant dysfunction.Radiographs show bone on bone arthritis medial and patellofemoral. She presents now for right Total Knee Arthroplasty.     Laboratory Data: Admission on 05/17/2021, Discharged on 05/19/2021  Component Date Value Ref Range Status   WBC 05/18/2021 7.2  4.0 - 10.5 K/uL Final   RBC 05/18/2021 3.56 (L)  3.87 - 5.11 MIL/uL Final   Hemoglobin 05/18/2021 10.8 (L)  12.0 - 15.0 g/dL Final   HCT 05/20/2021 32.8 (L)  36.0 - 46.0 % Final   MCV 05/18/2021 92.1  80.0 - 100.0 fL Final   MCH 05/18/2021 30.3  26.0 - 34.0 pg Final   MCHC 05/18/2021 32.9  30.0 - 36.0 g/dL Final   RDW 05/20/2021 12.4  11.5 - 15.5 % Final   Platelets 05/18/2021 178  150 - 400 K/uL Final   nRBC 05/18/2021 0.0  0.0 - 0.2 % Final   Performed at [facility], [address], [place], [place]   Sodium  05/18/2021 136  135 - 145 mmol/L Final   Potassium 05/18/2021 4.1  3.5 - 5.1 mmol/L Final   Chloride 05/18/2021 103  98 - 111 mmol/L Final   CO2 05/18/2021 27  22 - 32 mmol/L Final   Glucose, Bld 05/18/2021 119 (H)  70 - 99 mg/dL Final   Glucose reference range applies only to samples taken after fasting for at least 8 hours.   BUN 05/18/2021 14  8 - 23 mg/dL Final   Creatinine, Ser 05/18/2021 0.84  0.44 - 1.00 mg/dL Final   Calcium 05/20/2021 8.7 (L)  8.9 - 10.3 mg/dL Final   GFR, Estimated 05/18/2021 >60  >60 mL/min Final   Comment: (NOTE) Calculated using the CKD-EPI Creatinine Equation (2021)    Anion gap 05/18/2021 6  5 - 15 Final   Performed at [facility], [address] [place], [place]   WBC 05/19/2021 9.8  4.0 - 10.5 K/uL Final   RBC 05/19/2021 3.51 (L)  3.87 - 5.11 MIL/uL Final   Hemoglobin 05/19/2021 10.8 (L)  12.0 - 15.0 g/dL Final   HCT 05/21/2021 31.9 (L)  36.0 - 46.0 % Final   MCV 05/19/2021 90.9  80.0 - 100.0 fL Final   MCH 05/19/2021 30.8  26.0 - 34.0 pg Final   MCHC 05/19/2021 33.9  30.0 - 36.0 g/dL Final   RDW 05/21/2021 12.4  11.5 - 15.5 % Final   Platelets 05/19/2021 179  150 - 400 K/uL Final  nRBC 05/19/2021 0.0  0.0 - 0.2 % Final   Performed at [facility], [address] [place], [place] [address]   Sodium 05/19/2021 137  135 - 145 mmol/L Final   Potassium 05/19/2021 4.1  3.5 - 5.1 mmol/L Final   Chloride 05/19/2021 103  98 - 111 mmol/L Final   CO2 05/19/2021 28  22 - 32 mmol/L Final   Glucose, Bld 05/19/2021 111 (H)  70 - 99 mg/dL Final   Glucose reference range applies only to samples taken after fasting for at least 8 hours.   BUN 05/19/2021 15  8 - 23 mg/dL Final   Creatinine, Ser 05/19/2021 0.79  0.44 - 1.00 mg/dL Final   Calcium 19/14/7829 8.8 (L)  8.9 - 10.3 mg/dL Final   GFR, Estimated 05/19/2021 >60  >60 mL/min Final   Comment: (NOTE) Calculated using the CKD-EPI Creatinine Equation (2021)     Anion gap 05/19/2021 6  5 - 15 Final   Performed at [facility], [address], [place], [place] [address]  Orders Only on 05/13/2021  Component Date Value Ref Range Status   SARS Coronavirus 2 05/13/2021 RESULT: NEGATIVE   Final   Comment: RESULT: NEGATIVESARS-CoV-2 INTERPRETATION:A NEGATIVE  test result means that SARS-CoV-2 RNA was not present in the specimen above the limit of detection of this test. This does not preclude a possible SARS-CoV-2 infection and should not be used as the  sole basis for patient management decisions. Negative results must be combined with clinical observations, patient history, and epidemiological information. Optimum specimen types and timing for peak viral levels during infections caused by SARS-CoV-2  have not been determined. Collection of multiple specimens or types of specimens may be necessary to detect virus. Improper specimen collection and handling, sequence variability under primers/probes, or organism present below the limit of detection may  lead to false negative results. Positive and negative predictive values of testing are highly dependent on prevalence. False negative test results are more likely when prevalence of disease is high.The expected result is NEGATIVE.Fact S                          heet for  Healthcare Providers: [employer] Sheet for Patients: https://poole-freeman.org/ Reference Range - Negative   Hospital Outpatient Visit on 05/07/2021  Component Date Value Ref Range Status   MRSA, PCR 05/07/2021 NEGATIVE  NEGATIVE Final   Staphylococcus aureus 05/07/2021 NEGATIVE  NEGATIVE Final   Comment: (NOTE) The Xpert SA Assay (FDA approved for NASAL specimens in patients 20 years of age and older), is one component of a comprehensive surveillance program. It is not intended to diagnose infection nor to guide or monitor treatment. Performed at [facility], [address] [place], [place] [address]    Prothrombin Time 05/07/2021 12.7  11.4 - 15.2 seconds Final   INR 05/07/2021 1.0  0.8 - 1.2 Final   Comment: (NOTE) INR goal varies based on device and disease states. Performed at [facility], [address], [place], [place] [address]    WBC 05/07/2021 4.3  4.0 - 10.5 K/uL Final   RBC 05/07/2021 4.04  3.87 - 5.11 MIL/uL Final   Hemoglobin 05/07/2021 12.4  12.0 - 15.0 g/dL Final   HCT 29/52/8413 37.7  36.0 - 46.0 % Final   MCV 05/07/2021 93.3  80.0 - 100.0 fL Final   MCH 05/07/2021 30.7  26.0 - 34.0 pg Final  MCHC 05/07/2021 32.9  30.0 - 36.0 g/dL Final   RDW 73/42/8768 12.5  11.5 - 15.5 % Final   Platelets 05/07/2021 229  150 - 400 K/uL Final   nRBC 05/07/2021 0.0  0.0 - 0.2 % Final   Performed at [facility], [address], [place], [place] [address]   Sodium 05/07/2021 139  135 - 145 mmol/L Final   Potassium 05/07/2021 4.4  3.5 - 5.1 mmol/L Final   Chloride 05/07/2021 105  98 - 111 mmol/L Final   CO2 05/07/2021 28  22 - 32 mmol/L Final   Glucose, Bld 05/07/2021 98  70 - 99 mg/dL Final   Glucose reference range applies only to samples taken after fasting for at least 8 hours.   BUN 05/07/2021 18  8 - 23 mg/dL Final   Creatinine, Ser 05/07/2021 0.92  0.44 - 1.00 mg/dL Final   Calcium 62/09/5595 9.3  8.9 - 10.3 mg/dL Final   Total Protein 41/63/8453 7.4  6.5 - 8.1 g/dL Final   Albumin 64/68/0321 4.0  3.5 - 5.0 g/dL Final   AST 22/48/2500 21  15 - 41 U/L Final   ALT 05/07/2021 17  0 - 44 U/L Final   Alkaline Phosphatase 05/07/2021 93  38 - 126 U/L Final   Total Bilirubin 05/07/2021 0.7  0.3 - 1.2 mg/dL Final   GFR, Estimated 05/07/2021 >60  >60 mL/min Final   Comment: (NOTE) Calculated using the CKD-EPI Creatinine Equation (2021)    Anion gap 05/07/2021 6  5 - 15 Final   Performed at [facility], [address], [place], [place] [address]     X-Rays:No  results found.  EKG:No orders found for this or any previous visit.   Hospital Course: [name] is a 70 y.o. who was admitted to [facility]. They were brought to the operating room on 05/17/2021 and underwent Procedure(s): TOTAL KNEE ARTHROPLASTY.  Patient tolerated the procedure well and was later transferred to the recovery room and then to the orthopaedic floor for postoperative care. They were given PO and IV analgesics for pain control following their surgery. They were given 24 hours of postoperative antibiotics of  Anti-infectives (From admission, onward)    Start     Dose/Rate Route Frequency Ordered Stop   05/17/21 1430  ceFAZolin (ANCEF) IVPB 2g/100 mL premix        2 g 200 mL/hr over 30 Minutes Intravenous Every 6 hours 05/17/21 1322 05/17/21 2017   05/17/21 0805  ceFAZolin (ANCEF) 2-4 GM/100ML-% IVPB       Note to Pharmacy: Montel Clock   : cabinet override      05/17/21 0805 05/17/21 1057   05/17/21 0800  ceFAZolin (ANCEF) IVPB 2g/100 mL premix        2 g 200 mL/hr over 30 Minutes Intravenous On call to O.R. 05/17/21 0754 05/17/21 1056      and started on DVT prophylaxis in the form of Aspirin.   PT and OT were ordered for total joint protocol. Discharge planning consulted to help with postop disposition and equipment needs. Patient had a good night on the evening of surgery. They started to get up OOB with therapy on POD #0. Continued to work with therapy into POD #2. Pt was seen during rounds on day two and was ready to go home pending progress with therapy. Dressing was changed and the incision was clean, dry, and intact. Pt worked with therapy for two additional sessions  and was meeting their goals. She was discharged to home later that day in stable condition.  Diet: Regular diet Activity: WBAT Follow-up: in 2 weeks Disposition: Home with OPPT Discharged Condition: stable   Discharge Instructions     Call MD / Call 911   Complete by: As directed     If you experience chest pain or shortness of breath, CALL 911 and be transported to the hospital emergency room.  If you develope a fever above 101 F, pus (white drainage) or increased drainage or redness at the wound, or calf pain, call your surgeon's office.   Change dressing   Complete by: As directed    You may remove the bulky bandage (ACE wrap and gauze) two days after surgery. You will have an adhesive waterproof bandage underneath. Leave this in place until your first follow-up appointment.   Constipation Prevention   Complete by: As directed    Drink plenty of fluids.  Prune juice may be helpful.  You may use a stool softener, such as Colace (over the counter) 100 mg twice a day.  Use MiraLax (over the counter) for constipation as needed.   Diet - low sodium heart healthy   Complete by: As directed    Do not put a pillow under the knee. Place it under the heel.   Complete by: As directed    Driving restrictions   Complete by: As directed    No driving for two weeks   Post-operative opioid taper instructions:   Complete by: As directed    POST-OPERATIVE OPIOID TAPER INSTRUCTIONS: It is important to wean off of your opioid medication as soon as possible. If you do not need pain medication after your surgery it is ok to stop day one. Opioids include: Codeine, Hydrocodone(Norco, Vicodin), Oxycodone(Percocet, oxycontin) and hydromorphone amongst others.  Long term and even short term use of opiods can cause: Increased pain response Dependence Constipation Depression Respiratory depression And more.  Withdrawal symptoms can include Flu like symptoms Nausea, vomiting And more Techniques to manage these symptoms Hydrate well Eat regular healthy meals Stay active Use relaxation techniques(deep breathing, meditating, yoga) Do Not substitute Alcohol to help with tapering If you have been on opioids for less than two weeks and do not have pain than it is ok to stop all together.   Plan to wean off of opioids This plan should start within one week post op of your joint replacement. Maintain the same interval or time between taking each dose and first decrease the dose.  Cut the total daily intake of opioids by one tablet each day Next start to increase the time between doses. The last dose that should be eliminated is the evening dose.      TED hose   Complete by: As directed    Use stockings (TED hose) for three weeks on both leg(s).  You may remove them at night for sleeping.   Weight bearing as tolerated   Complete by: As directed       Allergies as of 05/19/2021   No Known Allergies      Medication List     STOP taking these medications    meloxicam 7.5 MG tablet Commonly known as: MOBIC       TAKE these medications    aspirin 325 MG EC tablet Take 1 tablet (325 mg total) by mouth 2 (two) times daily for 20 days. Then take one 81 mg aspirin once a day for three weeks. Then discontinue  aspirin. Notes to patient: Blood clot prevention; take daily for 20 more days; then decrease to  dose for 3 weeks   cholecalciferol 25 MCG (1000 UNIT) tablet Commonly known as: VITAMIN D3 Take 1,000 Units by mouth daily.   gabapentin 300 MG capsule Commonly known as: NEURONTIN Take a 300 mg capsule three times a day for two weeks following surgery.Then take a 300 mg capsule two times a day for two weeks. Then take a 300 mg capsule once a day for two weeks. Then discontinue.   methocarbamol 500 MG tablet Commonly known as: ROBAXIN Take 1 tablet (500 mg total) by mouth every 6 (six) hours as needed for muscle spasms.   oxyCODONE 5 MG immediate release tablet Commonly known as: Oxy IR/ROXICODONE Take 1-2 tablets (5-10 mg total) by mouth every 6 (six) hours as needed for severe pain.   traMADol 50 MG tablet Commonly known as: ULTRAM Take 1-2 tablets (50-100 mg total) by mouth every 6 (six) hours as needed for moderate pain.                Discharge Care Instructions  (From admission, onward)           Start     Ordered   05/18/21 0000  Weight bearing as tolerated        05/18/21 0811   05/18/21 0000  Change dressing       Comments: You may remove the bulky bandage (ACE wrap and gauze) two days after surgery. You will have an adhesive waterproof bandage underneath. Leave this in place until your first follow-up appointment.   05/18/21 1610            Follow-up Information     Ollen Gross, MD. Go on 06/01/2021.   Specialty: Orthopedic Surgery Why: You are scheduled for a follow up appointment on 06-01-21 at 2:00 pm. Contact information: [address] [place] [address] [place] [address] [phone]                 Signed: Arther Abbott, PA-C Orthopedic Surgery 06/02/2021, 12:33 PM

## 2021-06-17 DIAGNOSIS — Z4789 Encounter for other orthopedic aftercare: Secondary | ICD-10-CM | POA: Diagnosis not present

## 2021-10-08 DIAGNOSIS — Z79899 Other long term (current) drug therapy: Secondary | ICD-10-CM | POA: Diagnosis not present

## 2021-10-08 DIAGNOSIS — Z0000 Encounter for general adult medical examination without abnormal findings: Secondary | ICD-10-CM | POA: Diagnosis not present

## 2021-10-11 DIAGNOSIS — M1711 Unilateral primary osteoarthritis, right knee: Secondary | ICD-10-CM | POA: Diagnosis not present

## 2021-10-11 DIAGNOSIS — E785 Hyperlipidemia, unspecified: Secondary | ICD-10-CM | POA: Diagnosis not present

## 2021-10-11 DIAGNOSIS — R030 Elevated blood-pressure reading, without diagnosis of hypertension: Secondary | ICD-10-CM | POA: Diagnosis not present

## 2021-10-21 DIAGNOSIS — L209 Atopic dermatitis, unspecified: Secondary | ICD-10-CM | POA: Diagnosis not present

## 2021-10-21 DIAGNOSIS — R21 Rash and other nonspecific skin eruption: Secondary | ICD-10-CM | POA: Diagnosis not present

## 2022-01-14 DIAGNOSIS — Z131 Encounter for screening for diabetes mellitus: Secondary | ICD-10-CM | POA: Diagnosis not present

## 2022-01-14 DIAGNOSIS — Z79899 Other long term (current) drug therapy: Secondary | ICD-10-CM | POA: Diagnosis not present

## 2022-01-14 DIAGNOSIS — E785 Hyperlipidemia, unspecified: Secondary | ICD-10-CM | POA: Diagnosis not present

## 2022-01-17 DIAGNOSIS — E785 Hyperlipidemia, unspecified: Secondary | ICD-10-CM | POA: Diagnosis not present

## 2022-01-17 DIAGNOSIS — I10 Essential (primary) hypertension: Secondary | ICD-10-CM | POA: Diagnosis not present

## 2022-02-11 DIAGNOSIS — Z0100 Encounter for examination of eyes and vision without abnormal findings: Secondary | ICD-10-CM | POA: Diagnosis not present

## 2022-02-11 DIAGNOSIS — H524 Presbyopia: Secondary | ICD-10-CM | POA: Diagnosis not present

## 2022-02-25 DIAGNOSIS — Z79899 Other long term (current) drug therapy: Secondary | ICD-10-CM | POA: Diagnosis not present

## 2022-02-25 DIAGNOSIS — E785 Hyperlipidemia, unspecified: Secondary | ICD-10-CM | POA: Diagnosis not present

## 2022-02-25 DIAGNOSIS — I10 Essential (primary) hypertension: Secondary | ICD-10-CM | POA: Diagnosis not present

## 2022-02-28 DIAGNOSIS — I10 Essential (primary) hypertension: Secondary | ICD-10-CM | POA: Diagnosis not present

## 2022-02-28 DIAGNOSIS — E782 Mixed hyperlipidemia: Secondary | ICD-10-CM | POA: Diagnosis not present

## 2022-03-04 DIAGNOSIS — Z1152 Encounter for screening for COVID-19: Secondary | ICD-10-CM | POA: Diagnosis not present

## 2022-03-04 DIAGNOSIS — Z1159 Encounter for screening for other viral diseases: Secondary | ICD-10-CM | POA: Diagnosis not present

## 2022-04-13 DIAGNOSIS — I10 Essential (primary) hypertension: Secondary | ICD-10-CM | POA: Diagnosis not present

## 2022-04-13 DIAGNOSIS — Z87891 Personal history of nicotine dependence: Secondary | ICD-10-CM | POA: Diagnosis not present

## 2022-04-13 DIAGNOSIS — Z122 Encounter for screening for malignant neoplasm of respiratory organs: Secondary | ICD-10-CM | POA: Diagnosis not present

## 2022-04-13 DIAGNOSIS — F4321 Adjustment disorder with depressed mood: Secondary | ICD-10-CM | POA: Diagnosis not present

## 2022-04-13 DIAGNOSIS — E782 Mixed hyperlipidemia: Secondary | ICD-10-CM | POA: Diagnosis not present

## 2022-04-13 DIAGNOSIS — Z1231 Encounter for screening mammogram for malignant neoplasm of breast: Secondary | ICD-10-CM | POA: Diagnosis not present

## 2022-04-13 DIAGNOSIS — F321 Major depressive disorder, single episode, moderate: Secondary | ICD-10-CM | POA: Diagnosis not present

## 2022-04-13 DIAGNOSIS — Z713 Dietary counseling and surveillance: Secondary | ICD-10-CM | POA: Diagnosis not present

## 2022-04-13 DIAGNOSIS — Z0001 Encounter for general adult medical examination with abnormal findings: Secondary | ICD-10-CM | POA: Diagnosis not present

## 2022-04-13 DIAGNOSIS — Z6836 Body mass index (BMI) 36.0-36.9, adult: Secondary | ICD-10-CM | POA: Diagnosis not present

## 2022-04-13 DIAGNOSIS — G4701 Insomnia due to medical condition: Secondary | ICD-10-CM | POA: Diagnosis not present

## 2022-06-02 DIAGNOSIS — Z96651 Presence of right artificial knee joint: Secondary | ICD-10-CM | POA: Diagnosis not present

## 2022-06-10 DIAGNOSIS — E7849 Other hyperlipidemia: Secondary | ICD-10-CM | POA: Diagnosis not present

## 2022-06-10 DIAGNOSIS — Z79899 Other long term (current) drug therapy: Secondary | ICD-10-CM | POA: Diagnosis not present

## 2022-06-10 DIAGNOSIS — I10 Essential (primary) hypertension: Secondary | ICD-10-CM | POA: Diagnosis not present

## 2022-06-10 DIAGNOSIS — E559 Vitamin D deficiency, unspecified: Secondary | ICD-10-CM | POA: Diagnosis not present

## 2022-06-14 DIAGNOSIS — G4701 Insomnia due to medical condition: Secondary | ICD-10-CM | POA: Diagnosis not present

## 2022-06-14 DIAGNOSIS — R42 Dizziness and giddiness: Secondary | ICD-10-CM | POA: Diagnosis not present

## 2022-06-14 DIAGNOSIS — Z6837 Body mass index (BMI) 37.0-37.9, adult: Secondary | ICD-10-CM | POA: Diagnosis not present

## 2022-06-14 DIAGNOSIS — E782 Mixed hyperlipidemia: Secondary | ICD-10-CM | POA: Diagnosis not present

## 2022-06-14 DIAGNOSIS — Z713 Dietary counseling and surveillance: Secondary | ICD-10-CM | POA: Diagnosis not present

## 2022-06-14 DIAGNOSIS — I10 Essential (primary) hypertension: Secondary | ICD-10-CM | POA: Diagnosis not present

## 2022-06-14 DIAGNOSIS — Z79899 Other long term (current) drug therapy: Secondary | ICD-10-CM | POA: Diagnosis not present

## 2022-12-09 DIAGNOSIS — I10 Essential (primary) hypertension: Secondary | ICD-10-CM | POA: Diagnosis not present

## 2022-12-09 DIAGNOSIS — E782 Mixed hyperlipidemia: Secondary | ICD-10-CM | POA: Diagnosis not present

## 2022-12-14 DIAGNOSIS — E7849 Other hyperlipidemia: Secondary | ICD-10-CM | POA: Diagnosis not present

## 2022-12-14 DIAGNOSIS — R600 Localized edema: Secondary | ICD-10-CM | POA: Diagnosis not present

## 2022-12-14 DIAGNOSIS — I10 Essential (primary) hypertension: Secondary | ICD-10-CM | POA: Diagnosis not present

## 2022-12-14 DIAGNOSIS — J449 Chronic obstructive pulmonary disease, unspecified: Secondary | ICD-10-CM | POA: Diagnosis not present

## 2023-04-11 DIAGNOSIS — Z79891 Long term (current) use of opiate analgesic: Secondary | ICD-10-CM | POA: Diagnosis not present

## 2023-04-11 DIAGNOSIS — J449 Chronic obstructive pulmonary disease, unspecified: Secondary | ICD-10-CM | POA: Diagnosis not present

## 2023-04-11 DIAGNOSIS — G4709 Other insomnia: Secondary | ICD-10-CM | POA: Diagnosis not present

## 2023-04-11 DIAGNOSIS — Z13820 Encounter for screening for osteoporosis: Secondary | ICD-10-CM | POA: Diagnosis not present

## 2023-04-11 DIAGNOSIS — Z0000 Encounter for general adult medical examination without abnormal findings: Secondary | ICD-10-CM | POA: Diagnosis not present

## 2023-04-11 DIAGNOSIS — E7849 Other hyperlipidemia: Secondary | ICD-10-CM | POA: Diagnosis not present

## 2023-04-11 DIAGNOSIS — Z136 Encounter for screening for cardiovascular disorders: Secondary | ICD-10-CM | POA: Diagnosis not present

## 2023-04-11 DIAGNOSIS — Z1329 Encounter for screening for other suspected endocrine disorder: Secondary | ICD-10-CM | POA: Diagnosis not present

## 2023-04-11 DIAGNOSIS — Z1211 Encounter for screening for malignant neoplasm of colon: Secondary | ICD-10-CM | POA: Diagnosis not present

## 2023-04-11 DIAGNOSIS — I10 Essential (primary) hypertension: Secondary | ICD-10-CM | POA: Diagnosis not present

## 2023-04-11 DIAGNOSIS — Z1231 Encounter for screening mammogram for malignant neoplasm of breast: Secondary | ICD-10-CM | POA: Diagnosis not present

## 2023-04-18 ENCOUNTER — Other Ambulatory Visit (HOSPITAL_COMMUNITY): Payer: Self-pay | Admitting: Adult Health

## 2023-04-18 DIAGNOSIS — Z13820 Encounter for screening for osteoporosis: Secondary | ICD-10-CM

## 2023-04-19 DIAGNOSIS — Z0100 Encounter for examination of eyes and vision without abnormal findings: Secondary | ICD-10-CM | POA: Diagnosis not present

## 2023-04-19 DIAGNOSIS — H524 Presbyopia: Secondary | ICD-10-CM | POA: Diagnosis not present

## 2023-06-14 DIAGNOSIS — G4709 Other insomnia: Secondary | ICD-10-CM | POA: Diagnosis not present

## 2023-06-14 DIAGNOSIS — J449 Chronic obstructive pulmonary disease, unspecified: Secondary | ICD-10-CM | POA: Diagnosis not present

## 2023-06-14 DIAGNOSIS — E7849 Other hyperlipidemia: Secondary | ICD-10-CM | POA: Diagnosis not present

## 2023-06-14 DIAGNOSIS — I10 Essential (primary) hypertension: Secondary | ICD-10-CM | POA: Diagnosis not present

## 2023-06-14 DIAGNOSIS — R600 Localized edema: Secondary | ICD-10-CM | POA: Diagnosis not present

## 2023-08-02 ENCOUNTER — Emergency Department (HOSPITAL_COMMUNITY)
Admission: EM | Admit: 2023-08-02 | Discharge: 2023-08-03 | Disposition: A | Discharge status: Home / Self Care | Payer: Medicare HMO | Attending: Emergency Medicine | Admitting: Emergency Medicine

## 2023-08-02 ENCOUNTER — Encounter (HOSPITAL_COMMUNITY): Payer: Self-pay

## 2023-08-02 ENCOUNTER — Emergency Department (HOSPITAL_COMMUNITY): Payer: Medicare HMO

## 2023-08-02 ENCOUNTER — Other Ambulatory Visit: Payer: Self-pay

## 2023-08-02 DIAGNOSIS — R600 Localized edema: Secondary | ICD-10-CM | POA: Diagnosis not present

## 2023-08-02 DIAGNOSIS — I10 Essential (primary) hypertension: Secondary | ICD-10-CM | POA: Diagnosis not present

## 2023-08-02 DIAGNOSIS — I700 Atherosclerosis of aorta: Secondary | ICD-10-CM | POA: Diagnosis not present

## 2023-08-02 DIAGNOSIS — Z79899 Other long term (current) drug therapy: Secondary | ICD-10-CM | POA: Insufficient documentation

## 2023-08-02 DIAGNOSIS — R079 Chest pain, unspecified: Secondary | ICD-10-CM | POA: Diagnosis not present

## 2023-08-02 DIAGNOSIS — R0789 Other chest pain: Secondary | ICD-10-CM | POA: Diagnosis not present

## 2023-08-02 DIAGNOSIS — I2510 Atherosclerotic heart disease of native coronary artery without angina pectoris: Secondary | ICD-10-CM | POA: Diagnosis not present

## 2023-08-02 HISTORY — DX: Hyperlipidemia, unspecified: E78.5

## 2023-08-02 HISTORY — DX: Essential (primary) hypertension: I10

## 2023-08-02 LAB — BASIC METABOLIC PANEL
Anion gap: 9 (ref 5–15)
BUN: 17 mg/dL (ref 8–23)
CO2: 25 mmol/L (ref 22–32)
Calcium: 9.4 mg/dL (ref 8.9–10.3)
Chloride: 105 mmol/L (ref 98–111)
Creatinine, Ser: 1.04 mg/dL — ABNORMAL HIGH (ref 0.44–1.00)
GFR, Estimated: 57 mL/min — ABNORMAL LOW (ref >60)
Glucose, Bld: 78 mg/dL (ref 70–99)
Potassium: 3.6 mmol/L (ref 3.5–5.1)
Sodium: 139 mmol/L (ref 135–145)

## 2023-08-02 LAB — LIPASE, BLOOD: Lipase: 37 U/L (ref 11–51)

## 2023-08-02 LAB — CBC WITH DIFFERENTIAL/PLATELET
Abs Immature Granulocytes: 0.01 10*3/uL (ref 0.00–0.07)
Basophils Absolute: 0 10*3/uL (ref 0.0–0.1)
Basophils Relative: 1 %
Eosinophils Absolute: 0.2 10*3/uL (ref 0.0–0.5)
Eosinophils Relative: 5 %
HCT: 35.6 % — ABNORMAL LOW (ref 36.0–46.0)
Hemoglobin: 11.8 g/dL — ABNORMAL LOW (ref 12.0–15.0)
Immature Granulocytes: 0 %
Lymphocytes Relative: 37 %
Lymphs Abs: 1.7 10*3/uL (ref 0.7–4.0)
MCH: 29.9 pg (ref 26.0–34.0)
MCHC: 33.1 g/dL (ref 30.0–36.0)
MCV: 90.1 fL (ref 80.0–100.0)
Monocytes Absolute: 0.4 10*3/uL (ref 0.1–1.0)
Monocytes Relative: 8 %
Neutro Abs: 2.3 10*3/uL (ref 1.7–7.7)
Neutrophils Relative %: 49 %
Platelets: 204 10*3/uL (ref 150–400)
RBC: 3.95 MIL/uL (ref 3.87–5.11)
RDW: 13.2 % (ref 11.5–15.5)
WBC: 4.6 10*3/uL (ref 4.0–10.5)
nRBC: 0 % (ref 0.0–0.2)

## 2023-08-02 LAB — D-DIMER, QUANTITATIVE: D-Dimer, Quant: 0.86 ug{FEU}/mL — ABNORMAL HIGH (ref 0.00–0.50)

## 2023-08-02 LAB — HEPATIC FUNCTION PANEL
ALT: 18 U/L (ref 0–44)
AST: 22 U/L (ref 15–41)
Albumin: 3.6 g/dL (ref 3.5–5.0)
Alkaline Phosphatase: 84 U/L (ref 38–126)
Bilirubin, Direct: 0.1 mg/dL (ref 0.0–0.2)
Indirect Bilirubin: 0.5 mg/dL (ref 0.3–0.9)
Total Bilirubin: 0.6 mg/dL (ref 0.0–1.2)
Total Protein: 6.8 g/dL (ref 6.5–8.1)

## 2023-08-02 LAB — TROPONIN I (HIGH SENSITIVITY)
Troponin I (High Sensitivity): 4 ng/L (ref <18)
Troponin I (High Sensitivity): 4 ng/L (ref <18)

## 2023-08-02 MED ORDER — IOHEXOL 350 MG/ML SOLN
75.0000 mL | Freq: Once | INTRAVENOUS | Status: AC | PRN
  Administered 2023-08-02: 75 mL via INTRAVENOUS

## 2023-08-02 NOTE — Discharge Instructions (Addendum)
You were seen today for chest pain and shortness of breath.  Your labs and imaging were all very reassuring at this time.  Low concern for any emergent pathology.  You were noted to have a hiatal hernia which could be the source of this.  However due to your right leg being more swollen and painful, I recommend that you follow-up tomorrow to have a ultrasound done of your right leg.  I have ordered and you are to call the number noted in this AVS to schedule that appointment sometime tomorrow.  If you begin experiencing any chest pain, shortness of breath, fever, dizziness, confusion return to the ED for further evaluation  It was a pleasure seeing you in the ER today.

## 2023-08-02 NOTE — ED Provider Notes (Signed)
[facility] Provider Note   CSN: [id number] Arrival date & time: 08/02/23  1349     History  Chief Complaint  Patient presents with   Chest Pain    MATYLDA FEHRING is a 73 y.o. female.   Chest Pain Associated symptoms: shortness of breath    Pt presents to the ED today w/ a 1 day Hx of chest pain and shortness of breath on exertion. Pain started last night while getting ready for bed. Describes the pain starting off being "burning" pain but has become "chest tightness."  Radiates into the epigastric region.  Previous medical Hx of HTN.   Endorses R leg pain and swelling but has been present x 1 year after surgery knee surgery 2 years ago.   Denies headache, vertigo, vision changes, abdominal pain, n/v/d, dysuria.     Home Medications Prior to Admission medications   Medication Sig Start Date End Date Taking? Authorizing Provider  losartan (COZAAR) 25 MG tablet Take 25 mg by mouth daily.   Yes [provider]  albuterol (VENTOLIN HFA) 108 (90 Base) MCG/ACT inhaler Inhale 2 puffs into the lungs every 4 (four) hours as needed for shortness of breath.    [provider]  atorvastatin (LIPITOR) 10 MG tablet Take 10 mg by mouth daily.    [provider]  cholecalciferol (VITAMIN D3) 25 MCG (1000 UNIT) tablet Take 1,000 Units by mouth daily.    [provider]  gabapentin (NEURONTIN) 300 MG capsule Take a 300 mg capsule three times a day for two weeks following surgery.Then take a 300 mg capsule two times a day for two weeks. Then take a 300 mg capsule once a day for two weeks. Then discontinue. 05/18/21   Edmisten, Lyn Hollingshead, PA  methocarbamol (ROBAXIN) 500 MG tablet Take 1 tablet (500 mg total) by mouth every 6 (six) hours as needed for muscle spasms. 05/18/21   Edmisten, Kristie L, PA  metoprolol tartrate (LOPRESSOR) 25 MG tablet Take 25 mg by mouth 2 (two) times daily.    [provider]   oxyCODONE (OXY IR/ROXICODONE) 5 MG immediate release tablet Take 1-2 tablets (5-10 mg total) by mouth every 6 (six) hours as needed for severe pain. 05/18/21   Edmisten, Lyn Hollingshead, PA  traMADol (ULTRAM) 50 MG tablet Take 1-2 tablets (50-100 mg total) by mouth every 6 (six) hours as needed for moderate pain. 05/18/21   Edmisten, Lyn Hollingshead, PA      Allergies    Patient has no known allergies.    Review of Systems   Review of Systems  Respiratory:  Positive for shortness of breath.   Cardiovascular:  Positive for chest pain.  All other systems reviewed and are negative.   Physical Exam Updated Vital Signs BP (!) 156/79   Pulse 60   Temp 98.7 F (37.1 C) (Oral)   Resp 12   Ht 5\' 3"  (1.6 m)   Wt 90.3 kg   SpO2 96%   BMI 35.26 kg/m  Physical Exam Vitals and nursing note reviewed.  Constitutional:      General: She is not in acute distress.    Appearance: Normal appearance. She is not ill-appearing.  HENT:     Head: Normocephalic and atraumatic.  Eyes:     Extraocular Movements: Extraocular movements intact.     Conjunctiva/sclera: Conjunctivae normal.  Cardiovascular:     Rate and Rhythm: Normal rate and regular rhythm.     Pulses:  Normal pulses.     Heart sounds: Normal heart sounds. No murmur heard.    No friction rub. No gallop.  Pulmonary:     Effort: Pulmonary effort is normal. No respiratory distress.     Breath sounds: Normal breath sounds. No decreased breath sounds, wheezing, rhonchi or rales.  Abdominal:     General: Abdomen is flat.     Palpations: Abdomen is soft.     Tenderness: There is no abdominal tenderness.  Musculoskeletal:     Right lower leg: Tenderness present. Edema present.     Left lower leg: No tenderness. Edema present.     Comments: Denna Haggard' sign negative  Skin:    General: Skin is warm and dry.     Coloration: Skin is not cyanotic or pale.     Findings: No erythema.  Neurological:     General: No focal deficit present.     Mental  Status: She is alert. Mental status is at baseline.  Psychiatric:        Mood and Affect: Mood normal.     ED Results / Procedures / Treatments   Labs (all labs ordered are listed, but only abnormal results are displayed) Labs Reviewed  BASIC METABOLIC PANEL - Abnormal; Notable for the following components:      Result Value   Creatinine, Ser 1.04 (*)    GFR, Estimated 57 (*)    All other components within normal limits  CBC WITH DIFFERENTIAL/PLATELET - Abnormal; Notable for the following components:   Hemoglobin 11.8 (*)    HCT 35.6 (*)    All other components within normal limits  D-DIMER, QUANTITATIVE - Abnormal; Notable for the following components:   D-Dimer, Quant 0.86 (*)    All other components within normal limits  LIPASE, BLOOD  HEPATIC FUNCTION PANEL  TROPONIN I (HIGH SENSITIVITY)  TROPONIN I (HIGH SENSITIVITY)    EKG None  Radiology CT Angio Chest PE W and/or Wo Contrast Result Date: 08/02/2023 CLINICAL DATA:  Pulmonary embolism (PE) suspected, low to intermediate prob, positive D-dimer worsening SOB and chest pain. Pt hypertensive in Triage and endorses heavy chest pain radiating to her back. EXAM: CT ANGIOGRAPHY CHEST WITH CONTRAST TECHNIQUE: Multidetector CT imaging of the chest was performed using the standard protocol during bolus administration of intravenous contrast. Multiplanar CT image reconstructions and MIPs were obtained to evaluate the vascular anatomy. RADIATION DOSE REDUCTION: This exam was performed according to the departmental dose-optimization program which includes automated exposure control, adjustment of the mA and/or kV according to patient size and/or use of iterative reconstruction technique. CONTRAST:  75mL OMNIPAQUE IOHEXOL 350 MG/ML SOLN COMPARISON:  Chest x-ray 08/02/2023 FINDINGS: Cardiovascular: Satisfactory opacification of the pulmonary arteries to the segmental level. No evidence of pulmonary embolism. Normal heart size. No significant  pericardial effusion. The thoracic aorta is normal in caliber. Mild atherosclerotic plaque of the thoracic aorta. Four-vessel coronary artery calcifications. The right brachiocephalic artery and left common carotid artery have a common origin. Mediastinum/Nodes: No enlarged mediastinal, hilar, or axillary lymph nodes. Thyroid gland, trachea, and esophagus demonstrate no significant findings. Tiny hiatal hernia. Lungs/Pleura: Biapical pleural/pulmonary scarring. No focal consolidation. No pulmonary nodule. No pulmonary mass. No pleural effusion. No pneumothorax. Upper Abdomen: No acute abnormality. Musculoskeletal: No chest wall abnormality. No suspicious lytic or blastic osseous lesions. No acute displaced fracture. Multilevel degenerative changes of the spine. Bilateral shoulder degenerative changes. Review of the MIP images confirms the above findings. IMPRESSION: 1. No pulmonary embolus. 2. Tiny hiatal hernia. 3.  No acute intrapulmonary abnormality. 4. Aortic Atherosclerosis (ICD10-I70.0) including four-vessel coronary artery calcifications. Electronically Signed   By: Tish Frederickson M.D.   On: 08/02/2023 23:31   DG Chest 2 View Result Date: 08/02/2023 CLINICAL DATA:  Chest pain. EXAM: CHEST - 2 VIEW COMPARISON:  None Available. FINDINGS: Bilateral lung fields are clear. Bilateral costophrenic angles are clear. Normal cardio-mediastinal silhouette. No acute osseous abnormalities. Probable old healed right clavicular fracture noted. The soft tissues are within normal limits. IMPRESSION: No active cardiopulmonary disease. Electronically Signed   By: Jules Schick M.D.   On: 08/02/2023 16:47    Procedures Procedures    Medications Ordered in ED Medications  iohexol (OMNIPAQUE) 350 MG/ML injection 75 mL (75 mLs Intravenous Contrast Given 08/02/23 2136)    ED Course/ Medical Decision Making/ A&P                             Geneva (Revised) Score: 8, Geneva Score Interpretation: Moderate Risk Group:  ~20-30% incidence of pulmonary embolism from several studies PERC Score: 2, PERC Score Interpretation: If any criteria are positive, the PERC rule cannot be used to rule out PE in this patient Medical Decision Making Amount and/or Complexity of Data Reviewed Labs: ordered. Radiology: ordered.  Risk Prescription drug management.   This patient is a 73 year old female who presents to the ED for concern of chest pain radiating into the back with hypertension.   Differential diagnoses prior to evaluation: The emergent differential diagnosis includes, but is not limited to, aortic aneurysm, ACS, PE, pancreatitis, hepatitis, this is not an exhaustive differential.   Past Medical History / Co-morbidities / Social History: Hypertension  Additional history: Chart reviewed. Pertinent results include: Patient was seen for edema lower extremity on 06/14/2023.  Lab Tests/Imaging studies: I personally interpreted labs/imaging and the pertinent results include:   BC was notable for mild anemia BMP was notable for mildly elevated creatinine of 1.04 and a mildly decreased GFR of 57  D-dimer was positive at 0.86 Lipase was 37 unremarkable Hepatic function was unremarkable Chest x-ray was unremarkable CT angio chest showed a mild hiatal hernia but was otherwise unremarkable for any acute abnormality. I agree with the radiologist interpretation.  Cardiac monitoring: EKG obtained and interpreted by myself and attending physician which shows: NSR   Medications: No medications needed or ordered at this time.  I have reviewed the patients home medicines and have made adjustments as needed.  ED Course:  Patient is a 73 year old female presents the ED today complaining of chest pain shortness of breath started yesterday evening.  States that is worse on exertion.  Described as a burning pain.  However due to right unilateral swelling and calf pain on palpation, D-dimer was ordered.  Physical exam was  remarkable only for unilateral swelling and calf pain on right side.  Patient has been noted placing her primary care for the swelling in both of the legs but had noticed that the right leg had been more swollen. D-dimer was noted to be positive.  Labs were otherwise unremarkable with negative troponin.  CTA was ordered and showed hiatal hernia with no other acute abnormalities at this time.  Patient vital signs on reevaluation have remained stable throughout the course of her time here.  Low suspicion for PE, ACS, CVA, pneumonia or infectious etiology.  However will order a outpatient ultrasound to have a DVT rule out in the right leg due to the unilateral swelling in  that leg.  Recommend patient follow-up with her primary care outpatient for further evaluation after DVT rule out.  I believe this patient is to be discharged at this time.   Disposition: After consideration of the diagnostic results and the patients response to treatment, I feel that patient benefit from discharge and treatment as above.   emergency department workup does not suggest an emergent condition requiring admission or immediate intervention beyond what has been performed at this time. The plan is: DVT rule out tomorrow, follow-up PCP, return for new or worsening symptoms. The patient is safe for discharge and has been instructed to return immediately for worsening symptoms, change in symptoms or any other concerns.   Final Clinical Impression(s) / ED Diagnoses Final diagnoses:  Chest pain, unspecified type    Rx / DC Orders ED Discharge Orders          Ordered    US Venous Img Lower Unilateral Right        08/02/23 2351              Lunette Stands, [place] 08/02/23 2358    Vanetta Mulders, MD 08/04/23 [phone]

## 2023-08-02 NOTE — ED Triage Notes (Signed)
Pt arrived via POV c/o worsening SOB and chest pain. Pt hypertensive in Triage and endorses heavy chest pain radiating to her back.

## 2023-08-04 ENCOUNTER — Other Ambulatory Visit (HOSPITAL_COMMUNITY): Payer: Medicare HMO

## 2023-08-04 ENCOUNTER — Ambulatory Visit (HOSPITAL_COMMUNITY)
Admission: RE | Admit: 2023-08-04 | Discharge: 2023-08-04 | Disposition: A | Discharge status: Home / Self Care | Payer: Medicare HMO | Source: Ambulatory Visit | Attending: Emergency Medicine

## 2023-08-04 DIAGNOSIS — M79661 Pain in right lower leg: Secondary | ICD-10-CM | POA: Diagnosis not present

## 2023-08-04 DIAGNOSIS — R600 Localized edema: Secondary | ICD-10-CM | POA: Insufficient documentation

## 2023-08-08 DIAGNOSIS — I10 Essential (primary) hypertension: Secondary | ICD-10-CM | POA: Diagnosis not present

## 2023-08-18 ENCOUNTER — Encounter: Payer: Self-pay | Admitting: Cardiology

## 2023-08-18 ENCOUNTER — Ambulatory Visit: Payer: Medicare HMO | Attending: Cardiology | Admitting: Cardiology

## 2023-08-18 ENCOUNTER — Other Ambulatory Visit: Payer: Self-pay

## 2023-08-18 VITALS — BP 165/90 | HR 66 | Ht 64.0 in | Wt 229.0 lb

## 2023-08-18 DIAGNOSIS — Z79899 Other long term (current) drug therapy: Secondary | ICD-10-CM

## 2023-08-18 DIAGNOSIS — R079 Chest pain, unspecified: Secondary | ICD-10-CM | POA: Diagnosis not present

## 2023-08-18 MED ORDER — LOSARTAN POTASSIUM 100 MG PO TABS: 100.0000 mg | ORAL_TABLET | Freq: Every day | ORAL | 3 refills | Status: DC

## 2023-08-18 NOTE — Progress Notes (Signed)
Clinical Summary [name] is a 73 y.o.female seen as a new patient for the following medical problems.  1.Chest pain - ER visit 08/02/23 - presented with chest pain, SOB - trop neg x2, EKG SR no acute ischemic changes - CXR no acute process - CT PE: no PE. +coronary atherosclerosis, aortic atherosclerosis   - symptoms had started the night prior to going to there ER.  - symptoms started while in bed. Midchest into epigastric area, buring pain in belly and into chest along with pressure. 6/10 in severity. Better with laying on stomach. +SOB. Pain lasted several hours, was able to go to sleep. Still some degree of discomfort, tightness and SOB - went to work but later went to ER.   - after ER did ok for a few days, then recurrent episode - recurrent pain while at work recurrent tightness and SOB, lasted several hours up to entire day.  - since that that time, some fatigue   CAD risk factors: HTN, HLD, remote 15 year smoking history, mother with CHF    2. Right leg swelling - Jan 2025 [place] was negative for DVT - prior right knee surgery, chronic swelling since   3. HTN - compliant with meds - home bp's 160s/90s  Past Medical History:  Diagnosis Date   Hyperlipidemia    Hypertension      No Known Allergies   Current Outpatient Medications  Medication Sig Dispense Refill   albuterol (VENTOLIN HFA) 108 (90 Base) MCG/ACT inhaler Inhale 2 puffs into the lungs every 4 (four) hours as needed for shortness of breath.     atorvastatin (LIPITOR) 10 MG tablet Take 10 mg by mouth daily.     cholecalciferol (VITAMIN D3) 25 MCG (1000 UNIT) tablet Take 1,000 Units by mouth daily.     furosemide (LASIX) 20 MG tablet Take 20 mg by mouth daily as needed for edema.     metoprolol succinate (TOPROL-XL) 25 MG 24 hr tablet Take 12.5 mg by mouth daily.     traZODone (DESYREL) 50 MG tablet TAKE 1 TABLET BY MOUTH AT BEDITME AS NEEDED FOR SLEEP     No current facility-administered  medications for this visit.     Past Surgical History:  Procedure Laterality Date   EYE SURGERY     FOOT SURGERY     5 screws put in left foot   TOTAL KNEE ARTHROPLASTY     TOTAL KNEE ARTHROPLASTY Right 05/17/2021   Procedure: TOTAL KNEE ARTHROPLASTY;  Surgeon: Ollen Gross, MD;  Location: [facility];  Service: Orthopedics;  Laterality: Right;   TUBAL LIGATION       No Known Allergies    Family History  Problem Relation Age of Onset   Heart disease Mother    Hypertension Mother    Diabetes Mother    Diabetes Maternal Grandmother    Diabetes Maternal Grandfather      Social History Ms. Hellums reports that she has quit smoking. She has never used smokeless tobacco. Ms. Auble reports current alcohol use.     Physical Examination Today's Vitals   08/18/23 1453 08/18/23 1552  BP: (!) 168/98 (!) 165/90  Pulse: 66   SpO2: 99%   Weight: 229 lb (103.9 kg)   Height: 5\' 4"  (1.626 m)    Body mass index is 39.31 kg/m.  Gen: resting comfortably, no acute distress HEENT: no scleral icterus, pupils equal round and reactive, no palptable cervical adenopathy,  CV: RRR, 2/6 systolic murmur rusb,  no jvd Resp: Clear to auscultation bilaterally GI: abdomen is soft, non-tender, non-distended, normal bowel sounds, no hepatosplenomegaly MSK: extremities are warm, no edema.  Skin: warm, no rash Neuro:  no focal deficits Psych: appropriate affect    Assessment and Plan  1.Chest pain - unclear etiology. Some atypical features given duration and somewhat positional. Some worrisome features given CAD risk factors, associated DOE, and midchest pressure - start workup with echo, pending results likely coronary CTA - if negative workup consider GI evaluation vs emperic PPI  2. HTN - above goal, increase losartan to 100mg  daily. Check bmet 2 weeks    F/u 6 weeks      Antoine Poche, M.D.,

## 2023-08-18 NOTE — Patient Instructions (Signed)
Medication Instructions:  Your physician has recommended you make the following change in your medication:   -Increase Losartan to 100 mg once daily.   *If you need a refill on your cardiac medications before your next appointment, please call your pharmacy*   Lab Work: In 2 weeks: -BMET  If you have labs (blood work) drawn today and your tests are completely normal, you will receive your results only by: MyChart Message (if you have MyChart) OR A paper copy in the mail If you have any lab test that is abnormal or we need to change your treatment, we will call you to review the results.   Testing/Procedures: Your physician has requested that you have an echocardiogram. Echocardiography is a painless test that uses sound waves to create images of your heart. It provides your doctor with information about the size and shape of your heart and how well your heart's chambers and valves are working. This procedure takes approximately one hour. There are no restrictions for this procedure. Please do NOT wear cologne, perfume, aftershave, or lotions (deodorant is allowed). Please arrive 15 minutes prior to your appointment time.  Please note: We ask at that you not bring children with you during ultrasound (echo/ vascular) testing. Due to room size and safety concerns, children are not allowed in the ultrasound rooms during exams. Our front office staff cannot provide observation of children in our lobby area while testing is being conducted. An adult accompanying a patient to their appointment will only be allowed in the ultrasound room at the discretion of the ultrasound technician under special circumstances. We apologize for any inconvenience.    Follow-Up: At [facility], you and your health needs are our priority.  As part of our continuing mission to provide you with exceptional heart care, we have created designated Provider Care Teams.  These Care Teams include your primary  Cardiologist (physician) and Advanced Practice Providers (APPs -  Physician Assistants and Nurse Practitioners) who all work together to provide you with the care you need, when you need it.  We recommend signing up for the patient portal called "MyChart".  Sign up information is provided on this After Visit Summary.  MyChart is used to connect with patients for Virtual Visits (Telemedicine).  Patients are able to view lab/test results, encounter notes, upcoming appointments, etc.  Non-urgent messages can be sent to your provider as well.   To learn more about what you can do with MyChart, go to [email or web address].    Your next appointment:   6 week(s)  Provider:   You may see one of the following Advanced Practice Providers on your designated Care Team:   [place], PA-C  Jacolyn Reedy, [place]     Other Instructions

## 2023-08-29 DIAGNOSIS — I10 Essential (primary) hypertension: Secondary | ICD-10-CM | POA: Diagnosis not present

## 2023-09-01 DIAGNOSIS — I10 Essential (primary) hypertension: Secondary | ICD-10-CM | POA: Diagnosis not present

## 2023-09-01 DIAGNOSIS — E7849 Other hyperlipidemia: Secondary | ICD-10-CM | POA: Diagnosis not present

## 2023-09-04 ENCOUNTER — Ambulatory Visit (HOSPITAL_COMMUNITY)
Admission: RE | Admit: 2023-09-04 | Discharge: 2023-09-04 | Disposition: A | Discharge status: Home / Self Care | Payer: Medicare HMO | Source: Ambulatory Visit | Attending: Cardiology | Admitting: Cardiology

## 2023-09-04 ENCOUNTER — Encounter: Payer: Self-pay | Admitting: Adult Health

## 2023-09-04 DIAGNOSIS — R079 Chest pain, unspecified: Secondary | ICD-10-CM | POA: Diagnosis not present

## 2023-09-04 LAB — ECHOCARDIOGRAM COMPLETE
Area-P 1/2: 5.13 cm2
S' Lateral: 2.9 cm

## 2023-09-04 NOTE — Progress Notes (Signed)
*  PRELIMINARY RESULTS* Echocardiogram 2D Echocardiogram has been performed.  Stacey Drain 09/04/2023, 3:25 PM

## 2023-09-12 DIAGNOSIS — J449 Chronic obstructive pulmonary disease, unspecified: Secondary | ICD-10-CM | POA: Diagnosis not present

## 2023-09-12 DIAGNOSIS — E7849 Other hyperlipidemia: Secondary | ICD-10-CM | POA: Diagnosis not present

## 2023-09-12 DIAGNOSIS — R600 Localized edema: Secondary | ICD-10-CM | POA: Diagnosis not present

## 2023-09-12 DIAGNOSIS — I10 Essential (primary) hypertension: Secondary | ICD-10-CM | POA: Diagnosis not present

## 2023-09-12 DIAGNOSIS — G4709 Other insomnia: Secondary | ICD-10-CM | POA: Diagnosis not present

## 2023-10-12 NOTE — Progress Notes (Unsigned)
 Cardiology Office Note    Date:  10/13/2023  ID:  Makhiya, Coburn Sep 13, 1950, MRN [id number] Cardiologist: Dina Rich, MD    History of Present Illness:    [name] is a 73 y.o. female with past medical history of coronary calcification by prior CT, HTN and HLD who presents to the office today for 6-week follow-up.  She was last examined by Dr. Wyline Mood in 08/2023 as a new patient referral following ED evaluation for chest pain. Workup at that time had shown negative troponins and CT was negative for PE but she was noted to have coronary calcification. Did report having recurrent episodes of chest pain but they could last for the entire day. Given her risk factors, an echocardiogram was recommended for further assessment and pending results, could pursue a Coronary CTA. It was recommended if negative workup, consider GI evaluation or starting PPI therapy. BP was elevated at the time of her office visit and Losartan was titrated to 100 mg daily.  Her echocardiogram was performed in 09/2023 and showed a preserved ejection fraction of 60 to 65%. She had no wall motion abnormalities, grade 1 diastolic dysfunction and normal RV function.There were no significant valve abnormalities. She did have increased flow velocities which would be secondary to anemia, thyrotoxicosis or high flow state.  In talking with the patient today, she reports having an episode of chest discomfort several weeks ago while at work (works at a [employer]). Reports the pain was more of a pressure that lasted throughout the day and she did feel more short of breath for 2 days following this. Says that she does get short of breath and has intermittent chest pressure when doing chores around her home. 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 Reports symptoms have been occurring for several months. Last episode of pain was approximately 2 weeks ago. Denies any specific  orthopnea or PND. Has experienced worsening lower extremity edema over the past few months despite taking Lasix 20 mg daily. She is a former smoker.  Studies Reviewed:   EKG: EKG is not ordered today.  Echocardiogram: 09/2023 IMPRESSIONS     1. Left ventricular ejection fraction, by estimation, is 60 to 65%. The  left ventricle has normal function. The left ventricle has no regional  wall motion abnormalities. Left ventricular diastolic parameters are  consistent with Grade I diastolic  dysfunction (impaired relaxation).   2. Right ventricular systolic function is normal. The right ventricular  size is normal.   3. Left atrial size was mildly dilated.   4. The mitral valve is normal in structure. No evidence of mitral valve  regurgitation. No evidence of mitral stenosis.   5. The aortic valve is tricuspid. There is mild calcification of the  aortic valve. Aortic valve regurgitation is not visualized. No aortic  stenosis is present.   6. The inferior vena cava is normal in size with greater than 50%  respiratory variability, suggesting right atrial pressure of 3 mmHg.   7. Increased flow velocities may be secondary to anemia, thyrotoxicosis,  hyperdynamic or high flow state.   Comparison(s): No prior Echocardiogram.    Physical Exam:   VS:  BP 126/80 (BP Location: Right Arm, Cuff Size: Large)   Pulse 63   Ht 5\' 4"  (1.626 m)   Wt 236 lb 12.8 oz (107.4 kg)   SpO2 100%   BMI 40.65 kg/m    Wt Readings  from Last 3 Encounters:  10/13/23 236 lb 12.8 oz (107.4 kg)  08/18/23 229 lb (103.9 kg)  08/02/23 199 lb 1.2 oz (90.3 kg)     GEN: Pleasant female appearing  in no acute distress NECK: No JVD; No carotid bruits CARDIAC: RRR, no murmurs, rubs, gallops RESPIRATORY:  Clear to auscultation without rales, wheezing or rhonchi  ABDOMEN: Appears non-distended. No obvious abdominal masses. EXTREMITIES: No clubbing or cyanosis. 1+ pitting edema bilaterally.  Distal pedal pulses are  2+ bilaterally.   Assessment and Plan:   1. Chest Pain/Dyspnea on Exertion/Coronary Calcification by CT - Her symptoms over the past several months are concerning for angina as she experiences dyspnea on exertion and does report episodes of chest pressure. Chest pressure can last for a few minutes to hours though and can occur at rest or with activity. Recent echocardiogram showed a preserved EF as outlined above with no regional wall motion abnormalities.   - Reviewed options with the patient in regards to further ischemic evaluation and she prefers a less aggressive approach initially. Will plan to obtain a Coronary CTA as previously recommended. In the interim, will start ASA 81 mg daily and she was provided with an Rx for SL NTG and instructions for use were reviewed. We also reviewed precautions for Emergency Department evaluation in the interim. If cardiac workup is reassuring, would consider PFT's.  2. Lower Extremity Edema - She does report worsening lower extremity edema over the past several weeks. Reviewed options with the patient and she is unsure if she would be able to tolerate compression stockings. Will titrate Lasix from 20 mg daily (listed as PRN but has been taking daily) to 40 mg daily and recheck a BMET next week. Reviewed the importance of limiting sodium intake.   3. HTN - Blood pressure is well-controlled at 126/80 during today's visit. This was previously elevated at home but has improved over the past few weeks as well. Continue current medical therapy for now with Losartan 100 mg daily and Toprol-XL 12.5 mg daily.  4. HLD - Followed by her PCP. LDL was at 90 when checked in 08/2023. She is currently on Atorvastatin 10 mg daily. Based off Coronary CT results, will likely require further titration of this.  Signed, Ellsworth Lennox, PA-C

## 2023-10-13 ENCOUNTER — Encounter: Payer: Self-pay | Admitting: Student

## 2023-10-13 ENCOUNTER — Ambulatory Visit: Payer: Medicare HMO | Attending: Student | Admitting: Student

## 2023-10-13 VITALS — BP 126/80 | HR 63 | Ht 64.0 in | Wt 236.8 lb

## 2023-10-13 DIAGNOSIS — E785 Hyperlipidemia, unspecified: Secondary | ICD-10-CM

## 2023-10-13 DIAGNOSIS — R0609 Other forms of dyspnea: Secondary | ICD-10-CM | POA: Diagnosis not present

## 2023-10-13 DIAGNOSIS — I10 Essential (primary) hypertension: Secondary | ICD-10-CM | POA: Diagnosis not present

## 2023-10-13 DIAGNOSIS — R079 Chest pain, unspecified: Secondary | ICD-10-CM | POA: Diagnosis not present

## 2023-10-13 DIAGNOSIS — R600 Localized edema: Secondary | ICD-10-CM | POA: Diagnosis not present

## 2023-10-13 MED ORDER — METOPROLOL TARTRATE 50 MG PO TABS: 50.0000 mg | ORAL_TABLET | ORAL | 0 refills | Status: DC

## 2023-10-13 MED ORDER — NITROGLYCERIN 0.4 MG SL SUBL: 0.4000 mg | SUBLINGUAL_TABLET | SUBLINGUAL | 3 refills | Status: AC | PRN

## 2023-10-13 MED ORDER — ASPIRIN 81 MG PO TBEC
81.0000 mg | DELAYED_RELEASE_TABLET | Freq: Every day | ORAL | Status: AC
Start: 2023-10-13 — End: ?

## 2023-10-13 MED ORDER — FUROSEMIDE 20 MG PO TABS: 20.0000 mg | ORAL_TABLET | Freq: Two times a day (BID) | ORAL | 3 refills | Status: DC

## 2023-10-13 NOTE — Patient Instructions (Signed)
 Medication Instructions:   Start Aspirin 81 mg Daily  Take Nitroglycerin 0.4 mg as needed for chest pain Take Lopressor 50 mg two hours prior to cardiac CT Scan  Do Not Take Toprol XL or Lasix on the morning of Cardiac CT Scan   *If you need a refill on your cardiac medications before your next appointment, please call your pharmacy*  Lab Work: Your physician recommends that you return for lab work on Wednesday.   If you have labs (blood work) drawn today and your tests are completely normal, you will receive your results only by: MyChart Message (if you have MyChart) OR A paper copy in the mail If you have any lab test that is abnormal or we need to change your treatment, we will call you to review the results.  Testing/Procedures:   Your cardiac CT will be scheduled at one of the below locations:   [facility] [address] [place], [place] [address] [phone]  OR  [facility] [address] [place], [place] [address] [phone]  OR   [facility] [address] [place], [place] [address] [phone]  OR   [facility] [address] [place], [place] [address] [phone]  OR   [facility] and V[facility] [address]  [place], [place] [address] Opening October 30, 2023  If scheduled at [facility], please arrive at the [facility] and Children's Entrance (Entrance C2) of [facility] 30 minutes prior to test start time. You can use the FREE valet parking offered at entrance C (encouraged to control the heart rate for the test)  Proceed to the [facility] Radiology Department (first floor) to check-in and test prep.  All radiology patients and guests should use entrance C2 at [facility], accessed from [facility], even though the hospital's physical address listed is [address].    If scheduled  at [facility] or [facility], please arrive 15 mins early for check-in and test prep.  There is spacious parking and easy access to the radiology department from the [facility] Heart and Vascular entrance. Please enter here and check-in with the desk attendant.   If scheduled at [facility], please arrive 30 minutes early for check-in and test prep.  Please follow these instructions carefully (unless otherwise directed):  An IV will be required for this test and Nitroglycerin will be given.  Hold all erectile dysfunction medications at least 3 days (72 hrs) prior to test. (Ie viagra, cialis, sildenafil, tadalafil, etc)   On the Night Before the Test: Be sure to Drink plenty of water. Do not consume any caffeinated/decaffeinated beverages or chocolate 12 hours prior to your test. Do not take any antihistamines 12 hours prior to your test.  On the Day of the Test: Drink plenty of water until 1 hour prior to the test. Do not eat any food 1 hour prior to test. You may take your regular medications prior to the test.  Take metoprolol (Lopressor) two hours prior to test. If you take Furosemide/Hydrochlorothiazide/Spironolactone/Chlorthalidone, please HOLD on the morning of the test. Patients who wear a continuous glucose monitor MUST remove the device prior to scanning. FEMALES- please wear underwire-free bra if available, avoid dresses & tight clothing  *For Clinical Staff only. Please instruct patient the following:* Heart Rate Medication Recommendations for Cardiac CT  Resting HR < 50 bpm  No medication  Resting HR 50-60 bpm and BP >110/50 mmHG   Consider Metoprolol tartrate 25 mg PO 90-120 min prior to scan  Resting HR 60-65 bpm and BP >110/50 mmHG  Metoprolol tartrate 50 mg PO 90-120 minutes prior to scan   Resting HR > 65 bpm and BP >110/50 mmHG  Metoprolol tartrate 100 mg PO 90-120 minutes prior to scan  Consider Ivabradine 10-15  mg PO or a calcium channel blocker for resting HR >60 bpm and contraindication to metoprolol tartrate  Consider Ivabradine 10-15 mg PO in combination with metoprolol tartrate for HR >80 bpm        After the Test: Drink plenty of water. After receiving IV contrast, you may experience a mild flushed feeling. This is normal. On occasion, you may experience a mild rash up to 24 hours after the test. This is not dangerous. If this occurs, you can take Benadryl 25 mg, Zyrtec, Claritin, or Allegra and increase your fluid intake. (Patients taking Tikosyn should avoid Benadryl, and may take Zyrtec, Claritin, or Allegra) If you experience trouble breathing, this can be serious. If it is severe call 911 IMMEDIATELY. If it is mild, please call our office.  We will call to schedule your test 2-4 weeks out understanding that some insurance companies will need an authorization prior to the service being performed.   For more information and frequently asked questions, please visit our website : [email or web address]  For non-scheduling related questions, please contact the cardiac imaging nurse navigator should you have any questions/concerns: Cardiac Imaging Nurse Navigators Direct Office Dial: [phone]   For scheduling needs, including cancellations and rescheduling, please call [place], [phone].   Follow-Up: At [facility], you and your health needs are our priority.  As part of our continuing mission to provide you with exceptional heart care, our providers are all part of one team.  This team includes your primary Cardiologist (physician) and Advanced Practice Providers or APPs (Physician Assistants and Nurse Practitioners) who all work together to provide you with the care you need, when you need it.  Your next appointment:   2 month(s)  Provider:   You may see Dina Rich, MD or one of the following Advanced Practice Providers on your designated Care Team:    Randall An, PA-C  Sc[place]ia [place], [place] Jacolyn Reedy, [place]     We recommend signing up for the patient portal called "MyChart".  Sign up information is provided on this After Visit Summary.  MyChart is used to connect with patients for Virtual Visits (Telemedicine).  Patients are able to view lab/test results, encounter notes, upcoming appointments, etc.  Non-urgent messages can be sent to your provider as well.   To learn more about what you can do with MyChart, go to [email or web address].   Other Instructions Thank you for choosing [facility]!

## 2023-10-18 DIAGNOSIS — R0609 Other forms of dyspnea: Secondary | ICD-10-CM | POA: Diagnosis not present

## 2023-10-18 DIAGNOSIS — R079 Chest pain, unspecified: Secondary | ICD-10-CM | POA: Diagnosis not present

## 2023-10-19 LAB — BASIC METABOLIC PANEL WITH GFR
BUN/Creatinine Ratio: 18 (ref 12–28)
BUN: 20 mg/dL (ref 8–27)
CO2: 24 mmol/L (ref 20–29)
Calcium: 9.5 mg/dL (ref 8.7–10.3)
Chloride: 103 mmol/L (ref 96–106)
Creatinine, Ser: 1.09 mg/dL — ABNORMAL HIGH (ref 0.57–1.00)
Glucose: 137 mg/dL — ABNORMAL HIGH (ref 70–99)
Potassium: 4.5 mmol/L (ref 3.5–5.2)
Sodium: 141 mmol/L (ref 134–144)
eGFR: 54 mL/min/{1.73_m2} — ABNORMAL LOW (ref >59)

## 2023-11-01 ENCOUNTER — Telehealth (HOSPITAL_COMMUNITY): Payer: Self-pay | Admitting: *Deleted

## 2023-11-01 NOTE — Telephone Encounter (Signed)
Reaching out to patient to offer assistance regarding upcoming cardiac imaging study; pt verbalizes understanding of appt date/time, parking situation and where to check in, pre-test NPO status and medications ordered, and verified current allergies; name and call back number provided for further questions should they arise  Gordy Clement RN Navigator Cardiac Imaging [facility] [phone] office [phone] cell  Patient to take '50mg'$  metoprolol tartrate two hours prior to her cardiac CT scan.  She is aware to arrive at 3:30PM.

## 2023-11-02 ENCOUNTER — Ambulatory Visit (HOSPITAL_COMMUNITY)
Admission: RE | Admit: 2023-11-02 | Discharge: 2023-11-02 | Disposition: A | Discharge status: Home / Self Care | Source: Ambulatory Visit | Attending: Student | Admitting: Student

## 2023-11-02 DIAGNOSIS — I2510 Atherosclerotic heart disease of native coronary artery without angina pectoris: Secondary | ICD-10-CM | POA: Insufficient documentation

## 2023-11-02 DIAGNOSIS — R079 Chest pain, unspecified: Secondary | ICD-10-CM | POA: Diagnosis present

## 2023-11-02 DIAGNOSIS — I700 Atherosclerosis of aorta: Secondary | ICD-10-CM | POA: Diagnosis not present

## 2023-11-02 DIAGNOSIS — R0609 Other forms of dyspnea: Secondary | ICD-10-CM | POA: Diagnosis present

## 2023-11-02 MED ORDER — NITROGLYCERIN 0.4 MG SL SUBL
0.8000 mg | SUBLINGUAL_TABLET | Freq: Once | SUBLINGUAL | Status: AC
  Administered 2023-11-02: 0.8 mg via SUBLINGUAL

## 2023-11-02 MED ORDER — NITROGLYCERIN 0.4 MG SL SUBL
SUBLINGUAL_TABLET | SUBLINGUAL | Status: AC
  Filled 2023-11-02: qty 2

## 2023-11-02 MED ORDER — IOHEXOL 350 MG/ML SOLN
100.0000 mL | Freq: Once | INTRAVENOUS | Status: AC | PRN
  Administered 2023-11-02: 100 mL via INTRAVENOUS

## 2023-11-09 ENCOUNTER — Telehealth: Payer: Self-pay | Admitting: Student

## 2023-11-09 DIAGNOSIS — Z79899 Other long term (current) drug therapy: Secondary | ICD-10-CM

## 2023-11-09 MED ORDER — ATORVASTATIN CALCIUM 20 MG PO TABS: 20.0000 mg | ORAL_TABLET | Freq: Every day | ORAL | 3 refills | Status: DC

## 2023-11-09 NOTE — Telephone Encounter (Signed)
 [name], [place] M, PA-C  [place], Lukisha G, LPN Please let the patient know that her Coronary CTA showed no significant blockages. She has mild stenosis (25-49%) along her left anterior descending artery which would not cause symptoms. Her LDL was at 90 when checked in 08/2023 and we prefer for this to be less than 70 to help prevent further plaque buildup. Would recommend titration of Atorvastatin from 10 mg daily to 20 mg daily and rechecking an FLP and LFT's in 2-3 months.

## 2023-11-09 NOTE — Telephone Encounter (Signed)
 The patient has been notified of the result and verbalized understanding.  All questions (if any) were answered. Annabel Barns, RN 11/09/2023 9:48 AM   New rx to [facility],will obtain labs in 2-3 months  PCP copied

## 2023-11-09 NOTE — Telephone Encounter (Signed)
Pt returning nurses call from yesterday regarding test results. Please advise 

## 2023-11-19 ENCOUNTER — Ambulatory Visit: Payer: Self-pay | Admitting: Student

## 2023-11-30 DIAGNOSIS — E7849 Other hyperlipidemia: Secondary | ICD-10-CM | POA: Diagnosis not present

## 2023-11-30 DIAGNOSIS — I10 Essential (primary) hypertension: Secondary | ICD-10-CM | POA: Diagnosis not present

## 2023-12-04 DIAGNOSIS — E7849 Other hyperlipidemia: Secondary | ICD-10-CM | POA: Diagnosis not present

## 2023-12-04 DIAGNOSIS — I10 Essential (primary) hypertension: Secondary | ICD-10-CM | POA: Diagnosis not present

## 2023-12-04 DIAGNOSIS — G4709 Other insomnia: Secondary | ICD-10-CM | POA: Diagnosis not present

## 2023-12-04 DIAGNOSIS — J449 Chronic obstructive pulmonary disease, unspecified: Secondary | ICD-10-CM | POA: Diagnosis not present

## 2023-12-21 NOTE — Progress Notes (Addendum)
 Cardiology Office Note    Date:  12/22/2023  ID:  Victoria, Burgess [birth year], MRN [id number] Cardiologist: Alvan Carrier, MD    History of Present Illness:    [name] is a 73 y.o. female with past medical history of coronary calcification by CT, HTN and HLD who presents to the office today for 40-month follow-up.  She was last examined by myself in 10/2023 and reported having intermittent episodes of chest discomfort over the past few weeks which could last from a few minutes up to hours at a time. Did report having worsening shortness of breath with activity as well. Given her symptoms and cardiac risk factors, a Coronary CTA was recommended for further assessment. She was started on ASA 81 mg daily and if cardiac workup was reassuring, was recommended to arrange for PFT's. Her Coronary CT showed a coronary calcium  score of 62.3 and she had mild stenosis with a 25 to 49% stenosis along the proximal to mid LAD but no obstructive disease. 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 The radiology over-read showed aortic atherosclerosis but no acute abnormalities. Given her CTA results, it was recommended to titrate Atorvastatin  from 10 mg daily to 20 mg daily with repeat FLP and LFT's in 2 to 3 months.  In talking with the patient today, she denies any recurrent chest pain resembling symptoms at the time of her last office visit. Still has dyspnea on exertion and reports this has been occurring for over 2 years.  She is a former smoker but quit 40+ years ago. She reports staying active as she frequently push mows parts of her yard for several hours at a time and also carries objects at work from [employer]. Denies any chest pain with this. Reports her breathing is typically worse in the warmer temperatures. No recent orthopnea or PND. She does have intermittent lower extremity edema but symptoms have improved since Lasix   was titrated the time of her last office visit. Previously intolerant to compression stockings.   Studies Reviewed:   EKG: EKG is not ordered today.  Echocardiogram: 09/2023 IMPRESSIONS     1. Left ventricular ejection fraction, by estimation, is 60 to 65%. The  left ventricle has normal function. The left ventricle has no regional  wall motion abnormalities. Left ventricular diastolic parameters are  consistent with Grade I diastolic  dysfunction (impaired relaxation).   2. Right ventricular systolic function is normal. The right ventricular  size is normal.   3. Left atrial size was mildly dilated.   4. The mitral valve is normal in structure. No evidence of mitral valve  regurgitation. No evidence of mitral stenosis.   5. The aortic valve is tricuspid. There is mild calcification of the  aortic valve. Aortic valve regurgitation is not visualized. No aortic  stenosis is present.   6. The inferior vena cava is normal in size with greater than 50%  respiratory variability, suggesting right atrial pressure of 3 mmHg.   7. Increased flow velocities may be secondary to anemia, thyrotoxicosis,  hyperdynamic or high flow state.   Comparison(s): No prior Echocardiogram.   Coronary CTA: 11/2023 IMPRESSION: 1. Coronary calcium  score of 62.3. This was 63rd percentile for age and sex matched control.   2. Total plaque volume 191 mm3 which is 45th percentile for age- and sex-matched controls. TPV is moderate.   3. Normal coronary origins with co-dominance.   4. CAD-RADS  2 Mild coronary artery disease. Mild stenosis (25-49%) due to calcified plaque in the proximal to mid LAD.   5. Aortic atherosclerosis.   6. 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   7. Patent foreman ovale.   RECOMMENDATION: Consider non-atherosclerotic causes of chest pain. Consider preventive therapy and risk factor modification.   Physical Exam:   VS:  BP 136/84    Pulse 63   Ht 5' 4 (1.626 m)   Wt 235 lb (106.6 kg)   SpO2 97%   BMI 40.34 kg/m    Wt Readings from Last 3 Encounters:  12/22/23 235 lb (106.6 kg)  10/13/23 236 lb 12.8 oz (107.4 kg)  08/18/23 229 lb (103.9 kg)     GEN: Well nourished, well developed female appearing in no acute distress NECK: No JVD; No carotid bruits CARDIAC: RRR, no murmurs, rubs, gallops RESPIRATORY:  Clear to auscultation without rales, wheezing or rhonchi  ABDOMEN: Appears non-distended. No obvious abdominal masses. EXTREMITIES: No clubbing or cyanosis. Trace lower extremity edema bilaterally.  Distal pedal pulses are 2+ bilaterally.   Assessment and Plan:   1. Coronary artery disease involving native coronary artery of native heart without angina pectoris/Dyspnea on Exertion - Recent Coronary CTA showed mild stenosis as outlined above but no significant blockages. Reviewed the report in detail with the patient today. -  She denies any recurrent chest pain but does report dyspnea on exertion. Given her recent reassuring cardiac workup with echo and Coronary CTA, will obtain PFT's due to her history of tobacco use. If abnormal, would refer to Pulmonology for further management.  2. Bilateral lower extremity edema - Echocardiogram in 09/2023 showed a preserved EF of 60 to 65% with grade 1 diastolic dysfunction and normal RV function. - She has been on Lasix  20 mg twice daily and recent labs on 10/18/2023 showed creatinine was at 1.09 with K+ 4.5. She also had repeat labs on 11/30/2023 by review of [facility] DXA and creatinine was stable at 1.02. Continue current medical therapy for now. If her lower extremity worsens, could switch to Torsemide for improved bioavailability but she reports very frequent urination with Lasix  at this time. We also reviewed the importance of continuing to limit her sodium intake.  3. Essential hypertension - BP is at 136/84 during today's visit. Continue current medical therapy with  Losartan  100 mg daily and Toprol -XL 12.5 mg daily.  4. Hyperlipidemia LDL goal <70 - FLP in 08/2023 showed her LDL was at 90. Atorvastatin  was titrated from 10 mg daily to 20 mg daily and repeat FLP was rechecked by her PCP a few weeks later and LDL had improved to 85. We reviewed that this was likely too soon to reassess the effect of the medication change and would recheck again in 3 to 4 months for reassessment. AST and ALT were normal at 25 and 22 respectively when checked last month.   Signed, Laymon CHRISTELLA Qua, PA-C

## 2023-12-22 ENCOUNTER — Encounter: Payer: Self-pay | Admitting: Student

## 2023-12-22 ENCOUNTER — Ambulatory Visit: Attending: Student | Admitting: Student

## 2023-12-22 VITALS — BP 136/84 | HR 63 | Ht 64.0 in | Wt 235.0 lb

## 2023-12-22 DIAGNOSIS — R600 Localized edema: Secondary | ICD-10-CM

## 2023-12-22 DIAGNOSIS — E785 Hyperlipidemia, unspecified: Secondary | ICD-10-CM | POA: Diagnosis not present

## 2023-12-22 DIAGNOSIS — I2510 Atherosclerotic heart disease of native coronary artery without angina pectoris: Secondary | ICD-10-CM | POA: Diagnosis not present

## 2023-12-22 DIAGNOSIS — R0609 Other forms of dyspnea: Secondary | ICD-10-CM | POA: Diagnosis not present

## 2023-12-22 DIAGNOSIS — I10 Essential (primary) hypertension: Secondary | ICD-10-CM

## 2023-12-22 NOTE — Patient Instructions (Signed)
 Medication Instructions:  Your physician recommends that you continue on your current medications as directed. Please refer to the Current Medication list given to you today.  *If you need a refill on your cardiac medications before your next appointment, please call your pharmacy*  Lab Work: Complete Lipid Panel in September/October  If you have labs (blood work) drawn today and your tests are completely normal, you will receive your results only by: MyChart Message (if you have MyChart) OR A paper copy in the mail If you have any lab test that is abnormal or we need to change your treatment, we will call you to review the results.  Testing/Procedures: Your physician has recommended that you have a pulmonary function test. Pulmonary Function Tests are a group of tests that measure how well air moves in and out of your lungs.   Follow-Up: At [facility], you and your health needs are our priority.  As part of our continuing mission to provide you with exceptional heart care, our providers are all part of one team.  This team includes your primary Cardiologist (physician) and Advanced Practice Providers or APPs (Physician Assistants and Nurse Practitioners) who all work together to provide you with the care you need, when you need it.  Your next appointment:   6 month(s)  Provider:   You may see Armida Lander, MD or one of the following Advanced Practice Providers on your designated Care Team:   Woodfin Hays, PA-C  Scotesia [place], [place] Theotis Flake, [place]     We recommend signing up for the patient portal called MyChart.  Sign up information is provided on this After Visit Summary.  MyChart is used to connect with patients for Virtual Visits (Telemedicine).  Patients are able to view lab/test results, encounter notes, upcoming appointments, etc.  Non-urgent messages can be sent to your provider as well.   To learn more about what you can do with MyChart, go to  [email or web address].   Other Instructions

## 2024-01-09 ENCOUNTER — Ambulatory Visit (HOSPITAL_COMMUNITY)
Admission: RE | Admit: 2024-01-09 | Discharge: 2024-01-09 | Disposition: A | Discharge status: Home / Self Care | Source: Ambulatory Visit | Attending: Student | Admitting: Student

## 2024-01-09 DIAGNOSIS — R0609 Other forms of dyspnea: Secondary | ICD-10-CM | POA: Insufficient documentation

## 2024-01-09 LAB — PULMONARY FUNCTION TEST
DL/VA % pred: 104 %
DL/VA: 4.34 ml/min/mmHg/L
DLCO unc % pred: 91 %
DLCO unc: 17.51 ml/min/mmHg
FEF 25-75 Post: 2.59 L/s
FEF 25-75 Pre: 1.72 L/s
FEF2575-%Change-Post: 50 %
FEF2575-%Pred-Post: 143 %
FEF2575-%Pred-Pre: 95 %
FEV1-%Change-Post: 8 %
FEV1-%Pred-Post: 97 %
FEV1-%Pred-Pre: 89 %
FEV1-Post: 2.16 L
FEV1-Pre: 1.98 L
FEV1FVC-%Change-Post: 1 %
FEV1FVC-%Pred-Pre: 102 %
FEV6-%Change-Post: 6 %
FEV6-%Pred-Post: 97 %
FEV6-%Pred-Pre: 91 %
FEV6-Post: 2.71 L
FEV6-Pre: 2.54 L
FEV6FVC-%Change-Post: 0 %
FEV6FVC-%Pred-Post: 103 %
FEV6FVC-%Pred-Pre: 104 %
FVC-%Change-Post: 7 %
FVC-%Pred-Post: 93 %
FVC-%Pred-Pre: 87 %
FVC-Post: 2.73 L
FVC-Pre: 2.55 L
Post FEV1/FVC ratio: 79 %
Post FEV6/FVC ratio: 99 %
Pre FEV1/FVC ratio: 78 %
Pre FEV6/FVC Ratio: 100 %
RV % pred: 126 %
RV: 2.82 L
TLC % pred: 111 %
TLC: 5.65 L

## 2024-01-09 MED ORDER — ALBUTEROL SULFATE (2.5 MG/3ML) 0.083% IN NEBU
2.5000 mg | INHALATION_SOLUTION | Freq: Once | RESPIRATORY_TRACT | Status: AC
  Administered 2024-01-09: 2.5 mg via RESPIRATORY_TRACT

## 2024-01-11 ENCOUNTER — Ambulatory Visit: Payer: Self-pay | Admitting: Student

## 2024-01-11 DIAGNOSIS — R0600 Dyspnea, unspecified: Secondary | ICD-10-CM

## 2024-01-11 NOTE — Telephone Encounter (Signed)
 Results discussed with patient and she still has dyspnea and would like referral to pulmonary.

## 2024-01-11 NOTE — Telephone Encounter (Signed)
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 If she is still having dyspnea, would refer to [facility]  Pulmonology. ----- Message ----- From: Interface, Lab In Three Zero One Sent: 01/09/2024   3:28 PM EDT To: Laymon CHRISTELLA Qua, PA-C

## 2024-01-18 ENCOUNTER — Ambulatory Visit: Admitting: Internal Medicine

## 2024-01-18 ENCOUNTER — Encounter: Payer: Self-pay | Admitting: Internal Medicine

## 2024-01-18 VITALS — BP 140/84 | HR 59 | Ht 65.0 in | Wt 238.0 lb

## 2024-01-18 DIAGNOSIS — Z87891 Personal history of nicotine dependence: Secondary | ICD-10-CM

## 2024-01-18 DIAGNOSIS — R0609 Other forms of dyspnea: Secondary | ICD-10-CM

## 2024-01-18 NOTE — Progress Notes (Signed)
 [name], female    DOB: Nov 12, 1950    MRN: [id number]   Brief patient profile:  73  yowf  quit smoking 1990 @ ? About 200-240 lbs  referred to pulmonary clinic in [place]  01/18/2024 by Bertina Qua  for new doe x 2023 with minimally abn PFTs 01/09/24  x for ERV of 26% @ 238 lbs   Pt not previously seen by PCCM service.     History of Present Illness  01/18/2024  Pulmonary/ 1st office eval/ Sherlene Rickel / [facility] @ 238 lbs Chief Complaint  Patient presents with   Pulmonary Consult    Referred by Laymon Qua, PA. Pt c/o DOE for the past year, progressively getting worse. She gets winded with exertion such as showering.   Dyspnea:  100 ft to mb flat and back to house has to sit down to recover breathing  ? Better inhaler  Cough: sometimes with walking and sometimes at hs nonproductive  Sleep: flat bed with 2 pillows breathing/ coughing do not typcially keep her up  SABA use: 3 x times a day never before exertion 02: none     No obvious day to day or daytime pattern/variability or assoc excess/ purulent sputum or mucus plugs or hemoptysis or cp or chest tightness, subjective wheeze or overt sinus or hb symptoms.    Also denies any obvious fluctuation of symptoms with weather or environmental changes or other aggravating or alleviating factors except as outlined above   No unusual exposure hx or h/o childhood pna/ asthma or knowledge of premature birth.  Current Allergies, Complete Past Medical History, Past Surgical History, Family History, and Social History were reviewed in [employer] record.  ROS  The following are not active complaints unless bolded Hoarseness, sore throat, dysphagia, dental problems, itching, sneezing,  nasal congestion or discharge of excess mucus or purulent secretions, ear ache,   fever, chills, sweats, unintended wt loss or wt gain, classically pleuritic or exertional cp,  orthopnea pnd or arm/hand swelling  or leg  swelling, presyncope, palpitations, abdominal pain, anorexia, nausea, vomiting, diarrhea  or change in bowel habits or change in bladder habits, change in stools or change in urine, dysuria, hematuria,  rash, arthralgias, visual complaints, headache, numbness, weakness or ataxia or problems with walking or coordination,  change in mood or  memory.            Outpatient Medications Prior to Visit  Medication Sig Dispense Refill   albuterol  (VENTOLIN  HFA) 108 (90 Base) MCG/ACT inhaler Inhale 2 puffs into the lungs every 4 (four) hours as needed for shortness of breath.     aspirin  EC 81 MG tablet Take 1 tablet (81 mg total) by mouth daily. Swallow whole.     atorvastatin  (LIPITOR) 20 MG tablet Take 1 tablet (20 mg total) by mouth daily. 90 tablet 3   cholecalciferol (VITAMIN D3) 25 MCG (1000 UNIT) tablet Take 1,000 Units by mouth daily.     furosemide  (LASIX ) 20 MG tablet Take 1 tablet (20 mg total) by mouth 2 (two) times daily. 180 tablet 3   losartan  (COZAAR ) 100 MG tablet Take 1 tablet (100 mg total) by mouth daily. 90 tablet 3   metoprolol  succinate (TOPROL -XL) 25 MG 24 hr tablet Take 12.5 mg by mouth daily.     nitroGLYCERIN  (NITROSTAT ) 0.4 MG SL tablet Place 1 tablet (0.4 mg total) under the tongue every 5 (five) minutes as needed for chest pain. 25 tablet 3   traZODone (  DESYREL) 50 MG tablet TAKE 1 TABLET BY MOUTH AT BEDITME AS NEEDED FOR SLEEP     No facility-administered medications prior to visit.    Past Medical History:  Diagnosis Date   Hyperlipidemia    Hypertension       Objective:     BP (!) 140/84 (BP Location: Left Arm, Cuff Size: Normal)   Pulse (!) 59   Ht 5' 5 (1.651 m)   Wt 238 lb (108 kg)   SpO2 99% Comment: on RA  BMI 39.61 kg/m   SpO2: 99 % (on RA)  Pleasant amb mod obese (by BMI)  nad  HEENT : Oropharynx  clear/ 3 decayed bottom teeth remain, otherwise edentulous   -  Nasal turbinates nl    NECK :  without  apparent JVD/ palpable Nodes/TM     LUNGS: no acc muscle use,  Nl contour chest which is clear to A and P bilaterally without cough on insp or exp maneuvers   CV:  RRR  no s3 or murmur or increase in P2, and R>L pitting edema    ABD:  soft and nontender with limited excursion supine   MS:  Gait nl  ext warm without deformities Or obvious joint restrictions  calf tenderness, cyanosis or clubbing    SKIN: warm and dry without lesions    NEURO:  alert, approp, nl sensorium with  no motor or cerebellar deficits apparent.    I personally reviewed images and agree with radiology impression as follows:   Chest CT cuts on coronary study    11/02/23 Mediastinum/Nodes: There are no enlarged lymph nodes within the visualized mediastinum.  Lungs/Pleura: There is no pleural effusion. The visualized lungs appear clear.      Assessment   DOE (dyspnea on exertion) Onset 2023 p quit smoking in 1990 - Echo 09/04/2023  Grade 1 diastolic dysfunction with  Mild LAE - minimally abn PFTs 01/09/24  x for ERV of 26% @ 238 lbs   - 01/18/2024   Walked on RA  x  3  lap(s) =  approx 450  ft  @ mod/waddling pace, stopped due to end of study  with lowest 02 sats 99% with min sob   No evidence of significant airflow obst clinically or by pfts which are more striking for the reduction of ERV relative to the other lung vol c/w effects of body habitus, not a lung issue at all   However, have not completely ruled out asthma as a possible intermittent contributor to her symptoms of mild cough and doe so I encouraged her to try saba prn   Re SABA :  I spent extra time with pt today reviewing appropriate use of albuterol  for prn use on exertion with the following points: 1) saba is for relief of sob that does not improve by walking a slower pace or resting but rather if the pt does not improve after trying this first. 2) If the pt is convinced, as many are, that saba helps recover from activity faster then it's easy to tell if this is the case by  re-challenging : ie stop, take the inhaler, then p 5 minutes try the exact same activity (intensity of workload) that just caused the symptoms and see if they are substantially diminished or not after saba 3) if there is an activity that reproducibly causes the symptoms, try the saba 15 min before the activity on alternate days   If in fact the saba really does help, then  fine to continue to use it prn but advised may need to look closer at the maintenance regimen being used (for now=0) to achieve better control of airways disease with exertion.   Also rec  Reconditioning thru submax exercise with minimal wt bearing eg water aerobics or recumbent bike up working up to 30 min daily and f/u with CPST if not improving p 6-8 weeks   Pulmonary f/u is prn   Each maintenance medication was reviewed in detail including emphasizing most importantly the difference between maintenance and prns and under what circumstances the prns are to be triggered using an action plan format where appropriate.  Total time for H and P, chart review, counseling, reviewing hfa device(s) , directly observing portions of ambulatory 02 saturation study/ and generating customized AVS unique to this office visit / same day charting = 60 min for  new pt with multiple  refractory respiratory  symptoms of uncertain etiology                   Ozell America, MD 01/18/2024

## 2024-01-18 NOTE — Patient Instructions (Signed)
 Also  Ok to try albuterol  15 min before an activity (on alternating days)  that you know would usually make you short of breath and see if it makes any difference and if makes none then don't take albuterol  after activity unless you can't catch your breath as this means it's the resting that helps, not the albuterol .  Call for prescription of symicot 80 if you do better after using albterol.  To get the most out of exercise, you need to be continuously aware that you are short of breath, but never out of breath, for at least 30 minutes daily. As you improve, it will actually be easier for you to do the same amount of exercise  in  30 minutes so always push to the level where you are short of breath.        GERD (REFLUX)  is an extremely common cause of respiratory symptoms just like yours , many times with no obvious heartburn at all.   It can be treated with medication, but also with lifestyle changes including elevation of the head of your bed (ideally with 6 -8inch blocks under the headboard of your bed),  Smoking cessation, avoidance of late meals, excessive alcohol, and avoid fatty foods, chocolate, peppermint, colas, red wine, and acidic juices such as orange juice.  NO MINT OR MENTHOL  PRODUCTS SO NO COUGH DROPS  USE SUGARLESS CANDY INSTEAD (Jolley ranchers or Stover's or Life Savers) or even ice chips will also do - the key is to swallow to prevent all throat clearing. NO OIL BASED VITAMINS - use powdered substitutes.  Avoid fish oil when coughing.   Follow up if cough not better or breathing any worse.

## 2024-01-20 NOTE — Assessment & Plan Note (Signed)
 Onset 2023 p quit smoking in 1990 - Echo 09/04/2023  Grade 1 diastolic dysfunction with  Mild LAE - minimally abn PFTs 01/09/24  x for ERV of 26% @ 238 lbs   - 01/18/2024   Walked on RA  x  3  lap(s) =  approx 450  ft  @ mod/waddling pace, stopped due to end of study  with lowest 02 sats 99% with min sob   No evidence of significant airflow obst clinically or by pfts which are more striking for the reduction of ERV relative to the other lung vol c/w effects of body habitus, not a lung issue at all   However, have not completely ruled out asthma as a possible intermittent contributor to her symptoms of mild cough and doe so I encouraged her to try saba prn   Re SABA :  I spent extra time with pt today reviewing appropriate use of albuterol  for prn use on exertion with the following points: 1) saba is for relief of sob that does not improve by walking a slower pace or resting but rather if the pt does not improve after trying this first. 2) If the pt is convinced, as many are, that saba helps recover from activity faster then it's easy to tell if this is the case by re-challenging : ie stop, take the inhaler, then p 5 minutes try the exact same activity (intensity of workload) that just caused the symptoms and see if they are substantially diminished or not after saba 3) if there is an activity that reproducibly causes the symptoms, try the saba 15 min before the activity on alternate days   If in fact the saba really does help, then fine to continue to use it prn but advised may need to look closer at the maintenance regimen being used (for now=0) to achieve better control of airways disease with exertion.   Also rec  Reconditioning thru submax exercise with minimal wt bearing eg water aerobics or recumbent bike up working up to 30 min daily and f/u with CPST if not improving p 6-8 weeks   Pulmonary f/u is prn   Each maintenance medication was reviewed in detail including emphasizing most importantly  the difference between maintenance and prns and under what circumstances the prns are to be triggered using an action plan format where appropriate.  Total time for H and P, chart review, counseling, reviewing hfa device(s) , directly observing portions of ambulatory 02 saturation study/ and generating customized AVS unique to this office visit / same day charting = 60 min for  new pt with multiple  refractory respiratory  symptoms of uncertain etiology

## 2024-02-22 DIAGNOSIS — Z13820 Encounter for screening for osteoporosis: Secondary | ICD-10-CM | POA: Diagnosis not present

## 2024-02-22 DIAGNOSIS — J449 Chronic obstructive pulmonary disease, unspecified: Secondary | ICD-10-CM | POA: Diagnosis not present

## 2024-02-22 DIAGNOSIS — Z0000 Encounter for general adult medical examination without abnormal findings: Secondary | ICD-10-CM | POA: Diagnosis not present

## 2024-02-22 DIAGNOSIS — Z79891 Long term (current) use of opiate analgesic: Secondary | ICD-10-CM | POA: Diagnosis not present

## 2024-02-22 DIAGNOSIS — G4709 Other insomnia: Secondary | ICD-10-CM | POA: Diagnosis not present

## 2024-02-22 DIAGNOSIS — Z136 Encounter for screening for cardiovascular disorders: Secondary | ICD-10-CM | POA: Diagnosis not present

## 2024-02-22 DIAGNOSIS — I10 Essential (primary) hypertension: Secondary | ICD-10-CM | POA: Diagnosis not present

## 2024-02-22 DIAGNOSIS — Z1212 Encounter for screening for malignant neoplasm of rectum: Secondary | ICD-10-CM | POA: Diagnosis not present

## 2024-02-22 DIAGNOSIS — Z1211 Encounter for screening for malignant neoplasm of colon: Secondary | ICD-10-CM | POA: Diagnosis not present

## 2024-02-22 DIAGNOSIS — E7849 Other hyperlipidemia: Secondary | ICD-10-CM | POA: Diagnosis not present

## 2024-02-22 DIAGNOSIS — Z1231 Encounter for screening mammogram for malignant neoplasm of breast: Secondary | ICD-10-CM | POA: Diagnosis not present

## 2024-02-22 DIAGNOSIS — Z1329 Encounter for screening for other suspected endocrine disorder: Secondary | ICD-10-CM | POA: Diagnosis not present

## 2024-04-05 DIAGNOSIS — I10 Essential (primary) hypertension: Secondary | ICD-10-CM | POA: Diagnosis not present

## 2024-04-05 DIAGNOSIS — Z136 Encounter for screening for cardiovascular disorders: Secondary | ICD-10-CM | POA: Diagnosis not present

## 2024-04-05 DIAGNOSIS — Z1329 Encounter for screening for other suspected endocrine disorder: Secondary | ICD-10-CM | POA: Diagnosis not present

## 2024-04-06 LAB — HEPATIC FUNCTION PANEL
ALT: 18 IU/L (ref 0–32)
AST: 20 IU/L (ref 0–40)
Albumin: 4.1 g/dL (ref 3.8–4.8)
Alkaline Phosphatase: 121 IU/L (ref 49–135)
Bilirubin Total: 0.3 mg/dL (ref 0.0–1.2)
Bilirubin, Direct: 0.1 mg/dL (ref 0.00–0.40)
Total Protein: 7.1 g/dL (ref 6.0–8.5)

## 2024-04-06 LAB — LIPID PANEL
Chol/HDL Ratio: 2.5 ratio (ref 0.0–4.4)
Cholesterol, Total: 170 mg/dL (ref 100–199)
HDL: 68 mg/dL (ref >39)
LDL Chol Calc (NIH): 90 mg/dL (ref 0–99)
Triglycerides: 61 mg/dL (ref 0–149)
VLDL Cholesterol Cal: 12 mg/dL (ref 5–40)

## 2024-04-06 LAB — BASIC METABOLIC PANEL WITH GFR
BUN/Creatinine Ratio: 17 (ref 12–28)
BUN: 21 mg/dL (ref 8–27)
CO2: 24 mmol/L (ref 20–29)
Calcium: 9.4 mg/dL (ref 8.7–10.3)
Chloride: 102 mmol/L (ref 96–106)
Creatinine, Ser: 1.25 mg/dL — ABNORMAL HIGH (ref 0.57–1.00)
Glucose: 99 mg/dL (ref 70–99)
Potassium: 4.5 mmol/L (ref 3.5–5.2)
Sodium: 142 mmol/L (ref 134–144)
eGFR: 46 mL/min/1.73 — ABNORMAL LOW (ref >59)

## 2024-04-09 ENCOUNTER — Ambulatory Visit: Payer: Self-pay | Admitting: Cardiology

## 2024-04-09 DIAGNOSIS — E7849 Other hyperlipidemia: Secondary | ICD-10-CM | POA: Diagnosis not present

## 2024-04-09 DIAGNOSIS — J449 Chronic obstructive pulmonary disease, unspecified: Secondary | ICD-10-CM | POA: Diagnosis not present

## 2024-04-09 DIAGNOSIS — I10 Essential (primary) hypertension: Secondary | ICD-10-CM | POA: Diagnosis not present

## 2024-04-09 DIAGNOSIS — R7989 Other specified abnormal findings of blood chemistry: Secondary | ICD-10-CM | POA: Diagnosis not present

## 2024-04-09 MED ORDER — FUROSEMIDE 20 MG PO TABS: ORAL_TABLET | ORAL | 3 refills | Status: AC

## 2024-04-09 NOTE — Telephone Encounter (Signed)
-----   Message from Alvan Carrier sent at 04/09/2024  7:47 AM EDT -----  Mild decline in renal function. Verify taking lasix  20mg  bid, if so would change to 20mg  daily alternating days with 40mg  (if she prefers to take the 20mg  bid on those days or 40mg  once in AM either  is fine)  JINNY Alvan MD ----- Message ----- From: Rebecka Memos Lab Results In Sent: 04/06/2024   5:38 AM EDT To: Carrier JULIANNA Alvan, MD

## 2024-04-10 ENCOUNTER — Ambulatory Visit: Payer: Self-pay | Admitting: Student

## 2024-04-10 DIAGNOSIS — Z79899 Other long term (current) drug therapy: Secondary | ICD-10-CM

## 2024-04-10 DIAGNOSIS — E785 Hyperlipidemia, unspecified: Secondary | ICD-10-CM

## 2024-04-10 MED ORDER — ATORVASTATIN CALCIUM 40 MG PO TABS: 40.0000 mg | ORAL_TABLET | Freq: Every day | ORAL | 3 refills | Status: DC

## 2024-04-10 NOTE — Telephone Encounter (Signed)
-----   Message from Laymon CHRISTELLA Qua sent at 04/10/2024  7:45 AM EDT ----- Please let the patient know that her cholesterol still remains above goal as her LDL is at 90 and we prefer for this to be less than 70 given coronary calcification to help stabilize plaque and  prevent further progression. Liver enzymes are within a normal range. If she is tolerating Atorvastatin  well, would recommend titration to 40 mg daily and rechecking an FLP and LFT's in 2 months. If  any intolerances, could switch to Crestor 20 mg daily. ----- Message ----- From: Interface, [facility] Lab Results In Sent: 04/06/2024   5:39 AM EDT To: Laymon CHRISTELLA Qua, PA-C

## 2024-06-14 DIAGNOSIS — Z79899 Other long term (current) drug therapy: Secondary | ICD-10-CM | POA: Diagnosis not present

## 2024-06-14 DIAGNOSIS — E785 Hyperlipidemia, unspecified: Secondary | ICD-10-CM | POA: Diagnosis not present

## 2024-06-15 LAB — LIPID PANEL
Chol/HDL Ratio: 2.2 ratio (ref 0.0–4.4)
Cholesterol, Total: 169 mg/dL (ref 100–199)
HDL: 76 mg/dL (ref >39)
LDL Chol Calc (NIH): 81 mg/dL (ref 0–99)
Triglycerides: 61 mg/dL (ref 0–149)
VLDL Cholesterol Cal: 12 mg/dL (ref 5–40)

## 2024-06-15 LAB — HEPATIC FUNCTION PANEL
ALT: 21 IU/L (ref 0–32)
AST: 26 IU/L (ref 0–40)
Albumin: 4.3 g/dL (ref 3.8–4.8)
Alkaline Phosphatase: 124 IU/L (ref 49–135)
Bilirubin Total: 0.4 mg/dL (ref 0.0–1.2)
Bilirubin, Direct: 0.12 mg/dL (ref 0.00–0.40)
Total Protein: 7.2 g/dL (ref 6.0–8.5)

## 2024-06-17 ENCOUNTER — Ambulatory Visit: Admitting: Cardiology

## 2024-06-21 ENCOUNTER — Encounter: Payer: Self-pay | Admitting: Cardiology

## 2024-06-21 ENCOUNTER — Ambulatory Visit: Attending: Cardiology | Admitting: Cardiology

## 2024-06-21 VITALS — BP 164/102 | HR 63 | Ht 64.0 in | Wt 242.0 lb

## 2024-06-21 DIAGNOSIS — I2510 Atherosclerotic heart disease of native coronary artery without angina pectoris: Secondary | ICD-10-CM

## 2024-06-21 DIAGNOSIS — E782 Mixed hyperlipidemia: Secondary | ICD-10-CM

## 2024-06-21 DIAGNOSIS — I10 Essential (primary) hypertension: Secondary | ICD-10-CM | POA: Diagnosis not present

## 2024-06-21 MED ORDER — ATORVASTATIN CALCIUM 80 MG PO TABS: 80.0000 mg | ORAL_TABLET | Freq: Every day | ORAL | 3 refills | Status: AC

## 2024-06-21 MED ORDER — AMLODIPINE BESYLATE 5 MG PO TABS: 5.0000 mg | ORAL_TABLET | Freq: Every day | ORAL | 3 refills | Status: AC

## 2024-06-21 NOTE — Progress Notes (Signed)
 "     Clinical Summary [name] is a 73 y.o.female seen today for follow up of the following medical problems.   1.Chest pain - ER visit 08/02/23 - presented with chest pain, SOB - trop neg x2, EKG SR no acute ischemic changes - CXR no acute process - CT PE: no PE. +coronary atherosclerosis, aortic atherosclerosis     - symptoms had started the night prior to going to there ER.  - symptoms started while in bed. Midchest into epigastric area, buring pain in belly and into chest along with pressure. 6/10 in severity. Better with laying on stomach. +SOB. Pain lasted several hours, was able to go to sleep. Still some degree of discomfort, tightness and SOB - went to work but later went to ER.    - after ER did ok for a few days, then recurrent episode - recurrent pain while at work recurrent tightness and SOB, lasted several hours up to entire day.  - since that that time, some fatigue     CAD risk factors: HTN, HLD, remote 15 year smoking history, mother with CHF     09/2023 echo: LVEF 60-65%, no WMAs, grade I dd, normal RV,  -11/2023 coronary CTA: calcium  score 62, 63rd percentile, mild plaque. Main pulmonary artery dilated - nonspecific pains at times.  - she is on ASA, statin -    2. Right leg swelling - Jan 2025 US  was negative for DVT - prior right knee surgery, chronic swelling since     3. HTN - compliant with meds  4. HLD - 06/2024 TC 169 TG 61 HDL 76 LDL 81 Past Medical History:  Diagnosis Date   Hyperlipidemia    Hypertension      Allergies[1]   Current Outpatient Medications  Medication Sig Dispense Refill   albuterol  (VENTOLIN  HFA) 108 (90 Base) MCG/ACT inhaler Inhale 2 puffs into the lungs every 4 (four) hours as needed for shortness of breath.     aspirin  EC 81 MG tablet Take 1 tablet (81 mg total) by mouth daily. Swallow whole.     atorvastatin  (LIPITOR) 40 MG tablet Take 1 tablet (40 mg total) by mouth daily. 90 tablet 3   cholecalciferol (VITAMIN  D3) 25 MCG (1000 UNIT) tablet Take 1,000 Units by mouth daily.     furosemide  (LASIX ) 20 MG tablet Take 1 tablet (20 mg) daily alternating days with 2 tablets (40 mg). 270 tablet 3   losartan  (COZAAR ) 100 MG tablet Take 1 tablet (100 mg total) by mouth daily. 90 tablet 3   metoprolol  succinate (TOPROL -XL) 25 MG 24 hr tablet Take 12.5 mg by mouth daily.     nitroGLYCERIN  (NITROSTAT ) 0.4 MG SL tablet Place 1 tablet (0.4 mg total) under the tongue every 5 (five) minutes as needed for chest pain. 25 tablet 3   traZODone (DESYREL) 50 MG tablet TAKE 1 TABLET BY MOUTH AT BEDITME AS NEEDED FOR SLEEP     No current facility-administered medications for this visit.     Past Surgical History:  Procedure Laterality Date   EYE SURGERY     FOOT SURGERY     5 screws put in left foot   TOTAL KNEE ARTHROPLASTY     TOTAL KNEE ARTHROPLASTY Right 05/17/2021   Procedure: TOTAL KNEE ARTHROPLASTY;  Surgeon: Melodi Lerner, MD;  Location: [facility];  Service: Orthopedics;  Laterality: Right;   TUBAL LIGATION       Allergies[2]    Family History  Problem Relation Age of  Onset   Heart disease Mother    Hypertension Mother    Diabetes Mother    Diabetes Maternal Grandmother    Diabetes Maternal Grandfather      Social History Ms. Bucker reports that she has quit smoking. Her smoking use included cigarettes. She has a 10 pack-year smoking history. She has never used smokeless tobacco. Ms. Stigall reports that she does not currently use alcohol.     Physical Examination Today's Vitals   06/21/24 1057  BP: (!) 164/102  Pulse: 63  SpO2: 98%  Weight: 242 lb (109.8 kg)  Height: 5' 4 (1.626 m)   Body mass index is 41.54 kg/m.  Gen: resting comfortably, no acute distress HEENT: no scleral icterus, pupils equal round and reactive, no palptable cervical adenopathy,  CV: RRR, no mrg, no jvd Resp: Clear to auscultation bilaterally GI: abdomen is soft, non-tender, non-distended, normal bowel sounds, no  hepatosplenomegaly MSK: extremities are warm, no edema.  Skin: warm, no rash Neuro:  no focal deficits Psych: appropriate affect   Diagnostic Studies  09/2023 echo 1. Left ventricular ejection fraction, by estimation, is 60 to 65%. The  left ventricle has normal function. The left ventricle has no regional  wall motion abnormalities. Left ventricular diastolic parameters are  consistent with Grade I diastolic  dysfunction (impaired relaxation).   2. Right ventricular systolic function is normal. The right ventricular  size is normal.   3. Left atrial size was mildly dilated.   4. The mitral valve is normal in structure. No evidence of mitral valve  regurgitation. No evidence of mitral stenosis.   5. The aortic valve is tricuspid. There is mild calcification of the  aortic valve. Aortic valve regurgitation is not visualized. No aortic  stenosis is present.   6. The inferior vena cava is normal in size with greater than 50%  respiratory variability, suggesting right atrial pressure of 3 mmHg.   7. Increased flow velocities may be secondary to anemia, thyrotoxicosis,  hyperdynamic or high flow state.      Assessment and Plan  1.Chest pain/CAD - benign cardiac workup, coronary CTA mild CAD. symptoms have primarily resolved - continue to monitor - EKG today shows NSR, no ischemic changes   2. HTN - bp above goal, start norvasc  5mg  daily - on toprol  12.5mg  prior to establishing with us , I don't see a secondary indication. She reports on for just bp. We will discontinue - bp check 2 weeks  3. HLD - lipid above goal, increase atorvastaitn to 80mg  daily.       Dorn PHEBE Ross, M.D.     [1] No Known Allergies [2] No Known Allergies  "

## 2024-06-21 NOTE — Patient Instructions (Signed)
 Medication Instructions:  Your physician has recommended you make the following change in your medication:   -Stop Metoprolol  -Increase Atorvastatin  to 80 mg once daily -Start Norvasc 5 mg once daily   *If you need a refill on your cardiac medications before your next appointment, please call your pharmacy*  Lab Work: None If you have labs (blood work) drawn today and your tests are completely normal, you will receive your results only by: MyChart Message (if you have MyChart) OR A paper copy in the mail If you have any lab test that is abnormal or we need to change your treatment, we will call you to review the results.  Testing/Procedures: None  Follow-Up: At [facility], you and your health needs are our priority.  As part of our continuing mission to provide you with exceptional heart care, our providers are all part of one team.  This team includes your primary Cardiologist (physician) and Advanced Practice Providers or APPs (Physician Assistants and Nurse Practitioners) who all work together to provide you with the care you need, when you need it.  Your next appointment:   6 month(s)  Provider:   You may see Alvan Carrier, MD or one of the following Advanced Practice Providers on your designated Care Team:   Laymon Qua, PA-C  Scotesia Bradbury, [place] Olivia Pavy, [place]     We recommend signing up for the patient portal called MyChart.  Sign up information is provided on this After Visit Summary.  MyChart is used to connect with patients for Virtual Visits (Telemedicine).  Patients are able to view lab/test results, encounter notes, upcoming appointments, etc.  Non-urgent messages can be sent to your provider as well.   To learn more about what you can do with MyChart, go to [email or web address].   Other Instructions 2 week Nurse Visit- BP check

## 2024-07-05 ENCOUNTER — Ambulatory Visit: Attending: Internal Medicine

## 2024-07-05 VITALS — BP 136/72 | HR 72

## 2024-07-05 DIAGNOSIS — Z0130 Encounter for examination of blood pressure without abnormal findings: Secondary | ICD-10-CM | POA: Diagnosis not present

## 2024-07-05 NOTE — Patient Instructions (Signed)
 We will call you if Dr.Branch makes any changes with your medications.  Thank you for choosing [facility] !

## 2024-07-05 NOTE — Progress Notes (Signed)
 Nurse visit for BP check   136/72, HR 70  Patient states she had lots of fried fatback over New Years and noted her pressure was up this am. She understands the importance of 2 GM or less a day sodium intake

## 2024-07-19 ENCOUNTER — Other Ambulatory Visit: Payer: Self-pay | Admitting: Cardiology

## 2024-07-22 NOTE — Telephone Encounter (Signed)
 Creatine outside of Normal Range  In accordance with refill protocols, please review and address the following requirements before this medication refill can be authorized:  Labs
